# Patient Record
Sex: Female | Born: 1945
Health system: Southern US, Community
[De-identification: ages and names within clinical notes are randomized; demographics above are authoritative.]

## PROBLEM LIST (undated history)

## (undated) DIAGNOSIS — Z83719 Family history of colon polyps, unspecified: Secondary | ICD-10-CM

## (undated) DIAGNOSIS — E039 Hypothyroidism, unspecified: Secondary | ICD-10-CM

## (undated) DIAGNOSIS — J45909 Unspecified asthma, uncomplicated: Secondary | ICD-10-CM

## (undated) DIAGNOSIS — E7800 Pure hypercholesterolemia, unspecified: Secondary | ICD-10-CM

## (undated) DIAGNOSIS — IMO0002 Reserved for concepts with insufficient information to code with codable children: Secondary | ICD-10-CM

## (undated) DIAGNOSIS — C4491 Basal cell carcinoma of skin, unspecified: Secondary | ICD-10-CM

## (undated) DIAGNOSIS — R519 Headache, unspecified: Secondary | ICD-10-CM

## (undated) DIAGNOSIS — M8580 Other specified disorders of bone density and structure, unspecified site: Secondary | ICD-10-CM

## (undated) DIAGNOSIS — Z8371 Family history of colonic polyps: Secondary | ICD-10-CM

## (undated) DIAGNOSIS — R51 Headache: Secondary | ICD-10-CM

## (undated) DIAGNOSIS — N390 Urinary tract infection, site not specified: Secondary | ICD-10-CM

## (undated) DIAGNOSIS — L80 Vitiligo: Secondary | ICD-10-CM

## (undated) DIAGNOSIS — B001 Herpesviral vesicular dermatitis: Secondary | ICD-10-CM

## (undated) DIAGNOSIS — K219 Gastro-esophageal reflux disease without esophagitis: Secondary | ICD-10-CM

## (undated) DIAGNOSIS — L900 Lichen sclerosus et atrophicus: Secondary | ICD-10-CM

## (undated) DIAGNOSIS — E785 Hyperlipidemia, unspecified: Secondary | ICD-10-CM

## (undated) DIAGNOSIS — E079 Disorder of thyroid, unspecified: Secondary | ICD-10-CM

## (undated) HISTORY — PX: BREAST REDUCTION SURGERY: SHX8

## (undated) HISTORY — DX: Reserved for concepts with insufficient information to code with codable children: IMO0002

## (undated) HISTORY — DX: Disorder of thyroid, unspecified: E07.9

## (undated) HISTORY — DX: Pure hypercholesterolemia, unspecified: E78.00

## (undated) HISTORY — DX: Herpesviral vesicular dermatitis: B00.1

## (undated) HISTORY — PX: COLONOSCOPY: SHX174

## (undated) HISTORY — DX: Family history of colon polyps, unspecified: Z83.719

## (undated) HISTORY — PX: BUNIONECTOMY: SHX129

## (undated) HISTORY — DX: Other specified disorders of bone density and structure, unspecified site: M85.80

## (undated) HISTORY — DX: Vitiligo: L80

## (undated) HISTORY — PX: COLPOSCOPY: SHX161

## (undated) HISTORY — DX: Hypothyroidism, unspecified: E03.9

## (undated) HISTORY — DX: Hyperlipidemia, unspecified: E78.5

## (undated) HISTORY — DX: Basal cell carcinoma of skin, unspecified: C44.91

## (undated) HISTORY — DX: Urinary tract infection, site not specified: N39.0

## (undated) HISTORY — PX: ABDOMINAL HYSTERECTOMY: SHX81

## (undated) HISTORY — DX: Headache, unspecified: R51.9

## (undated) HISTORY — PX: TUBAL LIGATION: SHX77

## (undated) HISTORY — DX: Unspecified asthma, uncomplicated: J45.909

## (undated) HISTORY — DX: Family history of colonic polyps: Z83.71

## (undated) HISTORY — DX: Gastro-esophageal reflux disease without esophagitis: K21.9

## (undated) HISTORY — PX: OTHER SURGICAL HISTORY: SHX169

## (undated) HISTORY — DX: Lichen sclerosus et atrophicus: L90.0

## (undated) HISTORY — DX: Headache: R51

---

## 1998-09-13 ENCOUNTER — Ambulatory Visit (HOSPITAL_COMMUNITY): Admission: RE | Admit: 1998-09-13 | Discharge: 1998-09-13 | Payer: Self-pay | Admitting: Family Medicine

## 1998-09-13 ENCOUNTER — Encounter: Payer: Self-pay | Admitting: Family Medicine

## 1998-11-02 ENCOUNTER — Ambulatory Visit (HOSPITAL_COMMUNITY): Admission: RE | Admit: 1998-11-02 | Discharge: 1998-11-02 | Payer: Self-pay | Admitting: Cardiology

## 1999-11-08 ENCOUNTER — Encounter: Admission: RE | Admit: 1999-11-08 | Discharge: 1999-11-08 | Payer: Self-pay

## 2000-08-05 ENCOUNTER — Other Ambulatory Visit: Admission: RE | Admit: 2000-08-05 | Discharge: 2000-08-05 | Payer: Self-pay | Admitting: Family Medicine

## 2000-08-10 ENCOUNTER — Encounter: Payer: Self-pay | Admitting: Family Medicine

## 2000-08-10 ENCOUNTER — Encounter: Admission: RE | Admit: 2000-08-10 | Discharge: 2000-08-10 | Payer: Self-pay | Admitting: Family Medicine

## 2000-11-17 ENCOUNTER — Encounter: Payer: Self-pay | Admitting: Family Medicine

## 2000-11-17 ENCOUNTER — Encounter: Admission: RE | Admit: 2000-11-17 | Discharge: 2000-11-17 | Payer: Self-pay | Admitting: Family Medicine

## 2001-09-20 ENCOUNTER — Encounter: Payer: Self-pay | Admitting: Family Medicine

## 2001-09-20 ENCOUNTER — Ambulatory Visit (HOSPITAL_COMMUNITY): Admission: RE | Admit: 2001-09-20 | Discharge: 2001-09-20 | Payer: Self-pay | Admitting: Family Medicine

## 2001-11-18 ENCOUNTER — Encounter: Admission: RE | Admit: 2001-11-18 | Discharge: 2001-11-18 | Payer: Self-pay | Admitting: Family Medicine

## 2001-11-18 ENCOUNTER — Encounter: Payer: Self-pay | Admitting: Family Medicine

## 2002-05-29 ENCOUNTER — Emergency Department (HOSPITAL_COMMUNITY): Admission: EM | Admit: 2002-05-29 | Discharge: 2002-05-30 | Payer: Self-pay | Admitting: Anesthesiology

## 2002-05-29 ENCOUNTER — Encounter: Payer: Self-pay | Admitting: Emergency Medicine

## 2002-10-13 ENCOUNTER — Encounter: Admission: RE | Admit: 2002-10-13 | Discharge: 2002-10-13 | Payer: Self-pay | Admitting: Family Medicine

## 2002-10-13 ENCOUNTER — Encounter: Payer: Self-pay | Admitting: Family Medicine

## 2002-11-04 ENCOUNTER — Encounter: Admission: RE | Admit: 2002-11-04 | Discharge: 2002-11-04 | Payer: Self-pay | Admitting: Family Medicine

## 2002-11-04 ENCOUNTER — Encounter: Payer: Self-pay | Admitting: Family Medicine

## 2003-04-10 ENCOUNTER — Ambulatory Visit (HOSPITAL_COMMUNITY): Admission: RE | Admit: 2003-04-10 | Discharge: 2003-04-10 | Payer: Self-pay | Admitting: Family Medicine

## 2003-10-11 ENCOUNTER — Other Ambulatory Visit: Admission: RE | Admit: 2003-10-11 | Discharge: 2003-10-11 | Payer: Self-pay | Admitting: Family Medicine

## 2003-10-20 ENCOUNTER — Ambulatory Visit (HOSPITAL_COMMUNITY): Admission: RE | Admit: 2003-10-20 | Discharge: 2003-10-20 | Payer: Self-pay | Admitting: Family Medicine

## 2003-11-09 ENCOUNTER — Encounter: Admission: RE | Admit: 2003-11-09 | Discharge: 2003-11-09 | Payer: Self-pay | Admitting: Family Medicine

## 2003-11-23 ENCOUNTER — Ambulatory Visit: Admission: RE | Admit: 2003-11-23 | Discharge: 2003-11-23 | Payer: Self-pay | Admitting: Family Medicine

## 2004-01-31 ENCOUNTER — Ambulatory Visit: Payer: Self-pay | Admitting: Internal Medicine

## 2004-02-28 ENCOUNTER — Ambulatory Visit: Payer: Self-pay | Admitting: Internal Medicine

## 2004-04-02 ENCOUNTER — Ambulatory Visit: Payer: Self-pay | Admitting: Internal Medicine

## 2004-04-05 ENCOUNTER — Encounter (INDEPENDENT_AMBULATORY_CARE_PROVIDER_SITE_OTHER): Payer: Self-pay | Admitting: Specialist

## 2004-04-05 ENCOUNTER — Ambulatory Visit (HOSPITAL_COMMUNITY): Admission: RE | Admit: 2004-04-05 | Discharge: 2004-04-05 | Payer: Self-pay | Admitting: *Deleted

## 2004-06-07 ENCOUNTER — Encounter: Admission: RE | Admit: 2004-06-07 | Discharge: 2004-06-07 | Payer: Self-pay | Admitting: Endocrinology

## 2004-08-21 ENCOUNTER — Encounter: Admission: RE | Admit: 2004-08-21 | Discharge: 2004-08-21 | Payer: Self-pay | Admitting: Plastic Surgery

## 2004-09-17 HISTORY — PX: REDUCTION MAMMAPLASTY: SUR839

## 2004-10-23 ENCOUNTER — Other Ambulatory Visit: Admission: RE | Admit: 2004-10-23 | Discharge: 2004-10-23 | Payer: Self-pay | Admitting: Family Medicine

## 2005-07-25 ENCOUNTER — Encounter: Admission: RE | Admit: 2005-07-25 | Discharge: 2005-07-25 | Payer: Self-pay | Admitting: Plastic Surgery

## 2005-11-04 ENCOUNTER — Other Ambulatory Visit: Admission: RE | Admit: 2005-11-04 | Discharge: 2005-11-04 | Payer: Self-pay | Admitting: Family Medicine

## 2006-07-27 ENCOUNTER — Encounter: Admission: RE | Admit: 2006-07-27 | Discharge: 2006-07-27 | Payer: Self-pay | Admitting: Family Medicine

## 2006-11-16 ENCOUNTER — Other Ambulatory Visit: Admission: RE | Admit: 2006-11-16 | Discharge: 2006-11-16 | Payer: Self-pay | Admitting: Family Medicine

## 2006-11-19 ENCOUNTER — Encounter: Admission: RE | Admit: 2006-11-19 | Discharge: 2006-11-19 | Payer: Self-pay | Admitting: Family Medicine

## 2006-12-09 ENCOUNTER — Encounter: Admission: RE | Admit: 2006-12-09 | Discharge: 2006-12-09 | Payer: Self-pay | Admitting: Family Medicine

## 2007-10-12 ENCOUNTER — Encounter: Admission: RE | Admit: 2007-10-12 | Discharge: 2007-10-12 | Payer: Self-pay | Admitting: Family Medicine

## 2007-11-24 ENCOUNTER — Other Ambulatory Visit: Admission: RE | Admit: 2007-11-24 | Discharge: 2007-11-24 | Payer: Self-pay | Admitting: Family Medicine

## 2008-02-24 ENCOUNTER — Other Ambulatory Visit: Admission: RE | Admit: 2008-02-24 | Discharge: 2008-02-24 | Payer: Self-pay | Admitting: Family Medicine

## 2008-08-08 ENCOUNTER — Encounter (INDEPENDENT_AMBULATORY_CARE_PROVIDER_SITE_OTHER): Payer: Self-pay | Admitting: Obstetrics and Gynecology

## 2008-08-08 ENCOUNTER — Ambulatory Visit (HOSPITAL_COMMUNITY): Admission: RE | Admit: 2008-08-08 | Discharge: 2008-08-11 | Payer: Self-pay | Admitting: Obstetrics and Gynecology

## 2008-10-12 ENCOUNTER — Encounter: Admission: RE | Admit: 2008-10-12 | Discharge: 2008-10-12 | Payer: Self-pay | Admitting: Family Medicine

## 2009-01-24 ENCOUNTER — Encounter: Admission: RE | Admit: 2009-01-24 | Discharge: 2009-01-24 | Payer: Self-pay | Admitting: Family Medicine

## 2009-03-14 ENCOUNTER — Other Ambulatory Visit: Admission: RE | Admit: 2009-03-14 | Discharge: 2009-03-14 | Payer: Self-pay | Admitting: Obstetrics and Gynecology

## 2009-06-28 ENCOUNTER — Encounter: Admission: RE | Admit: 2009-06-28 | Discharge: 2009-06-28 | Payer: Self-pay | Admitting: Family Medicine

## 2009-10-15 ENCOUNTER — Encounter: Admission: RE | Admit: 2009-10-15 | Discharge: 2009-10-15 | Payer: Self-pay | Admitting: Family Medicine

## 2010-03-10 ENCOUNTER — Encounter: Payer: Self-pay | Admitting: Family Medicine

## 2010-03-18 ENCOUNTER — Other Ambulatory Visit (HOSPITAL_COMMUNITY)
Admission: RE | Admit: 2010-03-18 | Discharge: 2010-03-18 | Disposition: A | Discharge status: Home / Self Care | Payer: BC Managed Care – PPO | Source: Ambulatory Visit | Attending: Obstetrics and Gynecology | Admitting: Obstetrics and Gynecology

## 2010-03-18 ENCOUNTER — Other Ambulatory Visit: Payer: Self-pay | Admitting: Obstetrics and Gynecology

## 2010-03-18 DIAGNOSIS — Z01419 Encounter for gynecological examination (general) (routine) without abnormal findings: Secondary | ICD-10-CM | POA: Insufficient documentation

## 2010-03-26 ENCOUNTER — Other Ambulatory Visit: Payer: Self-pay | Admitting: Obstetrics and Gynecology

## 2010-03-26 DIAGNOSIS — N6459 Other signs and symptoms in breast: Secondary | ICD-10-CM

## 2010-03-28 ENCOUNTER — Ambulatory Visit
Admission: RE | Admit: 2010-03-28 | Discharge: 2010-03-28 | Disposition: A | Discharge status: Home / Self Care | Payer: BC Managed Care – PPO | Source: Ambulatory Visit | Attending: *Deleted | Admitting: *Deleted

## 2010-03-28 ENCOUNTER — Other Ambulatory Visit: Payer: Self-pay | Admitting: Obstetrics and Gynecology

## 2010-03-28 DIAGNOSIS — N6459 Other signs and symptoms in breast: Secondary | ICD-10-CM

## 2010-05-27 LAB — CROSSMATCH
ABO/RH(D): O POS
Antibody Screen: NEGATIVE

## 2010-05-27 LAB — CBC
HCT: 22 % — ABNORMAL LOW (ref 36.0–46.0)
HCT: 32.8 % — ABNORMAL LOW (ref 36.0–46.0)
HCT: 40.9 % (ref 36.0–46.0)
Hemoglobin: 11.4 g/dL — ABNORMAL LOW (ref 12.0–15.0)
Hemoglobin: 14 g/dL (ref 12.0–15.0)
Hemoglobin: 7.7 g/dL — CL (ref 12.0–15.0)
MCHC: 34.3 g/dL (ref 30.0–36.0)
MCHC: 34.7 g/dL (ref 30.0–36.0)
MCHC: 34.8 g/dL (ref 30.0–36.0)
MCV: 96 fL (ref 78.0–100.0)
Platelets: 176 10*3/uL (ref 150–400)
Platelets: 187 10*3/uL (ref 150–400)
RBC: 4.26 MIL/uL (ref 3.87–5.11)
RDW: 12.7 % (ref 11.5–15.5)
RDW: 13 % (ref 11.5–15.5)
RDW: 14.1 % (ref 11.5–15.5)
WBC: 15.7 10*3/uL — ABNORMAL HIGH (ref 4.0–10.5)
WBC: 9.6 10*3/uL (ref 4.0–10.5)

## 2010-05-27 LAB — COMPREHENSIVE METABOLIC PANEL
ALT: 34 U/L (ref 0–35)
Alkaline Phosphatase: 50 U/L (ref 39–117)
Calcium: 10 mg/dL (ref 8.4–10.5)
Creatinine, Ser: 0.66 mg/dL (ref 0.4–1.2)
Potassium: 4.9 mEq/L (ref 3.5–5.1)
Sodium: 143 mEq/L (ref 135–145)
Total Bilirubin: 0.8 mg/dL (ref 0.3–1.2)
Total Protein: 7 g/dL (ref 6.0–8.3)

## 2010-05-27 LAB — HEMOGLOBIN AND HEMATOCRIT, BLOOD: HCT: 23.9 % — ABNORMAL LOW (ref 36.0–46.0)

## 2010-05-27 LAB — TYPE AND SCREEN
ABO/RH(D): O POS
Antibody Screen: NEGATIVE

## 2010-05-27 LAB — BASIC METABOLIC PANEL
BUN: 10 mg/dL (ref 6–23)
Calcium: 8.5 mg/dL (ref 8.4–10.5)
Glucose, Bld: 107 mg/dL — ABNORMAL HIGH (ref 70–99)

## 2010-06-17 ENCOUNTER — Other Ambulatory Visit: Payer: Self-pay | Admitting: Family Medicine

## 2010-06-17 DIAGNOSIS — N6459 Other signs and symptoms in breast: Secondary | ICD-10-CM

## 2010-07-02 NOTE — Op Note (Signed)
Toni Curry, Toni Curry                  ACCOUNT NO.:  [id number]   MEDICAL RECORD NO.:  [id number]          PATIENT TYPE:  OIB   LOCATION:  9399                          FACILITY:  [facility]   PHYSICIAN:  Charles A. Delcambre, MDDATE OF BIRTH:  1945-12-08   DATE OF PROCEDURE:  08/08/2008  DATE OF DISCHARGE:                               OPERATIVE REPORT   PREOPERATIVE DIAGNOSES:  1. Uterine prolapse.  2. Cystocele.  3. Rectocele.   POSTOPERATIVE DIAGNOSES:  1. Uterine prolapse.  2. Cystocele.  3. Rectocele.   PROCEDURES:  1. Transvaginal hysterectomy.  2. Bilateral salpingo-oophorectomy.  3. Anterior repair.  4. Posterior repair.   SURGEON:  Charles A. Sydnee Cabal, MD   ASSISTANT:  Gerald Leitz, MD   ANESTHESIA:  General by the endotracheal route.   OPERATIVE FINDINGS:  Small uterus, small ovaries, normal tubes, large  cystocele, moderate rectocele.   SPECIMEN:  Uterus, tubes, and ovaries to pathology.   ESTIMATED BLOOD LOSS:  300 mL.   COUNTS:  Instrument, sponge, and needle count correct x2.   COMPLICATIONS:  None.   DESCRIPTION OF PROCEDURE:  The patient was taken to the operating room,  placed in supine position.  General anesthetic was induced without  difficulty.  She was then placed in dorsal lithotomy position.  Sterile  prep and drape was undertaken.  A weighted speculum was placed in  vagina.  Cervix was injected circumferentially with 1% lidocaine with  1:200 epinephrine, total of 10 mL.  Scoring incision was made around the  cervix.  Bladder was dissected anteriorly cutting the bladder pillars  and then finger dissection up to the peritoneal edge.  Peritoneum was  then entered with Metzenbaum scissors without damage to the bladder.  Posterior colpotomy was done without injury to the rectum or surrounding  structures.  The non-metal weighted speculum was placed into the  posterior colpotomy space.  Uterosacral ligaments were taken  bilaterally, transfixion stitched,  and held.  The uterine artery and  vessel were taken and transfixion stitched and held.  Third clamp went  through remainder of the way up and incorporated the tube and the round  ligament.  This was cut on either side and uterus was removed, free  ties, and transfixion stitches were placed on these pedicles.  Ovaries  were teased down and the tube and ovary were taken on either side with a  Kelly clamp and freeze tie, and then a transfixion stitch of 0 Vicryl.  There was some bleeding on the sidewall on either side just below the  pedicle 2-0 Vicryl, SH needle was used to achieve hemostasis, which was  excellent.  Peritoneum was then closed with 3-0 chromic running  nonlocking suture.  Attention was then turned to the anterior repair.  Bladder mucosa was undermined with Metzenbaum scissors and Allis clamps  were used to hold the bladder and the vaginal mucosa using the knife and  Metzenbaum scissors.  The cystocele was dissected off the vaginal  mucosa.  This was done on both sides adequately and without difficulty.  The cystocele was reduced with 3-0 chromic  pursestring and then  plicating stitches x6 were placed with 2-0 Vicryl across the endopelvic  fascia reducing the cystocele further.  Hemostasis was excellent after  this closure was done.  Excess vaginal mucosa was taken on both sides  and the vaginal mucosa was reapproximated with 2-0 Vicryl running  locking suture.  Attention was then turned to the posterior repair.  It  did appear that the rectocele was large enough to indicate repair.  Cuff  was closed horizontally with 2-0 Vicryl anchoring the cuff to the  uterosacral ligaments.  The triangle incision and excision on the  perineal body was done to expose the rectal mucosa and the vaginal  mucosa was dissected off the rectal mucosa without difficulty or injury  to the rectum.  Plicating stitches including the rectal pillars and the  endopelvic fascia included good repair and  good supportive shelf.  Minimal vaginal mucosa was excised so as not to cause too much  shortening of the vagina.  The vaginal mucosa was then closed like a  second-degree episiotomy or laceration with running locking 2-0 Vicryl  and then running up the perineal body.  One inch gauze was placed in the  vagina with Estrace cream.  The patient was taken to recovery with  physician in attendance having tolerated procedure well.      Charles A. Sydnee Cabal, MD  Electronically Signed     CAD/MEDQ  D:  08/08/2008  T:  08/09/2008  Job:  [id number]

## 2010-07-05 NOTE — Discharge Summary (Signed)
Toni Curry, Toni Curry                  ACCOUNT NO.:  [id number]   MEDICAL RECORD NO.:  [id number]          PATIENT TYPE:  OIB   LOCATION:  9319                          FACILITY:  [facility]   PHYSICIAN:  Charles A. Delcambre, MDDATE OF BIRTH:  30-Aug-1945   DATE OF ADMISSION:  08/08/2008  DATE OF DISCHARGE:  08/11/2008                               DISCHARGE SUMMARY   PRIMARY DISCHARGE DIAGNOSES:  1. Uterine prolapse.  2. Cystocele.  3. Rectocele.  4. Likely cuff hematoma.   PROCEDURE:  Transvaginal hysterectomy and bilateral salpingo-  oophorectomy, anterior and posterior repair.  The patient is discharged  home on August 11, 2008.  Postop day #3, that morning after transfusion,  hemoglobin 9.8 and hematocrit 28.1, white blood cell count 8.7,  platelets 176, see hospital course for specifics.  Pathology did return  a benign tissue.  She did have a low-grade squamous intraepithelial  lesion extending to the ectocervical margin.  There was a benign  endometrial polyp.  Ovaries were normal.  Uterus, ovaries, cervix, and  tubes were sent to pathology with findings as noted.  EKG was done and  was normal.  Sinus rhythm without evidence of ischemia.   HISTORY AND PHYSICAL:  Dictated on the chart.  The patient discharged  home on the day to discuss August 11, 2008, with precautions given for a  chest pain, shortness of breath, diaphoresis, radiation of pain down the  left arm or to the back, increased pain, or bleeding vaginally.  She was  given prescription of Darvocet-N 100, Percocet 1-2 p.o. q.4 h p.r.n.  #30, and Motrin 800 mg 1 p.o. q.8 h p.r.n. #30, Colace over-the-counter  b.i.d. 100 mg, and was recommended she was convalescent home.  No  lifting for 4-6 weeks greater than 20 pounds.  No driving for 1 week and  shower okay.  No bath for 4 weeks.  No sexual intercourse or vaginal  penetration with anything for 6 weeks and she was to take iron 1 tablet  once a day over-the-counter.  History and  physical is dictated on the  chart.   HOSPITAL COURSE:  The patient was admitted, underwent surgery as noted  above.  She had routine postoperative course.  On postop day #1,  hematocrit was 32.8, hemoglobin was 11.4.  She had some nausea and  vomiting and Percocet was discontinued.  CBC was ordered for the next  morning.  She had a funny feeling in her chest when walking as well as  at rest.  There was no radiation or diaphoresis.  She walked without  dizziness.  However, hemoglobin returned 7.7 and hematocrit 22.0,  platelet was 187.  Sodium 141, potassium 4.1, chloride 107, CO2 31, BUN  10, creatinine 0.66, glucose 107 random.  She had good urine output  overnight 1000 mL, but with hemoglobin going from preop 14.0 to postop  day 1, 11.4 then postop day 2, 7.7 as this was an incidental drawn.  Dr.  Richardson Dopp could not remember why she ordered this repeat test.  We did feel  that there was probable hematoma.  Hemoglobin was repeated in 4 hours  and was 8.2.  It is felt this was a disvariation in the lab value, but  consistent with the likely cuff hematoma, stable, however, she was  having symptoms of chest discomfort and electrocardiogram was done.  There was no evidence of ischemia.  I recommended that with her  weakness, tachycardia, and symptomatic chest, or heart findings that we  did transfuse her.  She gave informed consent excepted risks of  hepatitis, HIV, transfusion reaction of fluid overload.  She was  transfused 2 units of packed red cells with Lasix 20 mg between units,  Tylenol before each unit 650 mg, Benadryl 25 mg 42 units.  Blood  pressure, pulse remains stable, pulse in the 90-110 range, and then  after the transfusion, pulse down in the 18 range was noted.  Repeat  hemoglobin/hematocrit after the  transfusion was 9.8/28.1 respectively.  As hemoglobin had risen  appropriately from the 7.7 finding, I felt she was stable.  She looks  stable.  Vitals were stable.  Urine  output was good.  Pain was  controlled.  She was therefore discharged home on postop day #3.      Charles A. Sydnee Cabal, MD  Electronically Signed     CAD/MEDQ  D:  08/19/2008  T:  08/19/2008  Job:  [id number]

## 2010-10-10 ENCOUNTER — Other Ambulatory Visit: Payer: Self-pay | Admitting: Family Medicine

## 2010-10-10 DIAGNOSIS — Z1231 Encounter for screening mammogram for malignant neoplasm of breast: Secondary | ICD-10-CM

## 2010-10-28 ENCOUNTER — Ambulatory Visit
Admission: RE | Admit: 2010-10-28 | Discharge: 2010-10-28 | Disposition: A | Discharge status: Home / Self Care | Payer: BC Managed Care – PPO | Source: Ambulatory Visit | Attending: Family Medicine | Admitting: Family Medicine

## 2010-10-28 DIAGNOSIS — Z1231 Encounter for screening mammogram for malignant neoplasm of breast: Secondary | ICD-10-CM

## 2010-12-09 ENCOUNTER — Other Ambulatory Visit: Payer: Self-pay | Admitting: Surgery

## 2011-05-23 ENCOUNTER — Other Ambulatory Visit: Payer: Self-pay | Admitting: Obstetrics and Gynecology

## 2011-05-23 ENCOUNTER — Other Ambulatory Visit (HOSPITAL_COMMUNITY)
Admission: RE | Admit: 2011-05-23 | Discharge: 2011-05-23 | Disposition: A | Discharge status: Home / Self Care | Payer: BC Managed Care – PPO | Source: Ambulatory Visit | Attending: Obstetrics and Gynecology | Admitting: Obstetrics and Gynecology

## 2011-05-23 DIAGNOSIS — Z01419 Encounter for gynecological examination (general) (routine) without abnormal findings: Secondary | ICD-10-CM | POA: Insufficient documentation

## 2011-09-09 ENCOUNTER — Ambulatory Visit
Admission: RE | Admit: 2011-09-09 | Discharge: 2011-09-09 | Disposition: A | Discharge status: Home / Self Care | Payer: BC Managed Care – PPO | Source: Ambulatory Visit | Attending: Family Medicine | Admitting: Family Medicine

## 2011-09-09 ENCOUNTER — Other Ambulatory Visit: Payer: Self-pay | Admitting: Family Medicine

## 2011-09-09 DIAGNOSIS — M79675 Pain in left toe(s): Secondary | ICD-10-CM

## 2011-09-09 DIAGNOSIS — M79673 Pain in unspecified foot: Secondary | ICD-10-CM

## 2011-10-16 ENCOUNTER — Other Ambulatory Visit: Payer: Self-pay | Admitting: Family Medicine

## 2011-10-16 DIAGNOSIS — Z1231 Encounter for screening mammogram for malignant neoplasm of breast: Secondary | ICD-10-CM

## 2011-10-30 ENCOUNTER — Ambulatory Visit
Admission: RE | Admit: 2011-10-30 | Discharge: 2011-10-30 | Disposition: A | Discharge status: Home / Self Care | Payer: BC Managed Care – PPO | Source: Ambulatory Visit | Attending: Family Medicine | Admitting: Family Medicine

## 2011-10-30 DIAGNOSIS — Z1231 Encounter for screening mammogram for malignant neoplasm of breast: Secondary | ICD-10-CM

## 2012-06-16 ENCOUNTER — Other Ambulatory Visit (HOSPITAL_COMMUNITY)
Admission: RE | Admit: 2012-06-16 | Discharge: 2012-06-16 | Disposition: A | Discharge status: Home / Self Care | Payer: BC Managed Care – PPO | Source: Ambulatory Visit | Attending: Obstetrics and Gynecology | Admitting: Obstetrics and Gynecology

## 2012-06-16 ENCOUNTER — Other Ambulatory Visit: Payer: Self-pay | Admitting: Obstetrics and Gynecology

## 2012-06-16 DIAGNOSIS — Z1151 Encounter for screening for human papillomavirus (HPV): Secondary | ICD-10-CM | POA: Insufficient documentation

## 2012-06-16 DIAGNOSIS — N63 Unspecified lump in unspecified breast: Secondary | ICD-10-CM

## 2012-06-16 DIAGNOSIS — Z01419 Encounter for gynecological examination (general) (routine) without abnormal findings: Secondary | ICD-10-CM | POA: Insufficient documentation

## 2012-06-28 ENCOUNTER — Ambulatory Visit
Admission: RE | Admit: 2012-06-28 | Discharge: 2012-06-28 | Disposition: A | Discharge status: Home / Self Care | Payer: BC Managed Care – PPO | Source: Ambulatory Visit | Attending: Obstetrics and Gynecology | Admitting: Obstetrics and Gynecology

## 2012-06-28 DIAGNOSIS — N63 Unspecified lump in unspecified breast: Secondary | ICD-10-CM

## 2012-09-30 ENCOUNTER — Other Ambulatory Visit: Payer: Self-pay

## 2012-09-30 DIAGNOSIS — Z1231 Encounter for screening mammogram for malignant neoplasm of breast: Secondary | ICD-10-CM

## 2012-11-01 ENCOUNTER — Ambulatory Visit
Admission: RE | Admit: 2012-11-01 | Discharge: 2012-11-01 | Disposition: A | Discharge status: Home / Self Care | Payer: BC Managed Care – PPO | Source: Ambulatory Visit

## 2012-11-01 DIAGNOSIS — Z1231 Encounter for screening mammogram for malignant neoplasm of breast: Secondary | ICD-10-CM

## 2012-12-27 ENCOUNTER — Other Ambulatory Visit: Payer: Self-pay | Admitting: Family Medicine

## 2012-12-27 ENCOUNTER — Ambulatory Visit
Admission: RE | Admit: 2012-12-27 | Discharge: 2012-12-27 | Disposition: A | Discharge status: Home / Self Care | Payer: BC Managed Care – PPO | Source: Ambulatory Visit | Attending: Family Medicine | Admitting: Family Medicine

## 2012-12-27 DIAGNOSIS — M549 Dorsalgia, unspecified: Secondary | ICD-10-CM

## 2013-10-04 ENCOUNTER — Other Ambulatory Visit: Payer: Self-pay

## 2013-10-04 DIAGNOSIS — Z1231 Encounter for screening mammogram for malignant neoplasm of breast: Secondary | ICD-10-CM

## 2013-11-02 ENCOUNTER — Ambulatory Visit: Payer: BC Managed Care – PPO

## 2013-11-07 ENCOUNTER — Ambulatory Visit
Admission: RE | Admit: 2013-11-07 | Discharge: 2013-11-07 | Disposition: A | Discharge status: Home / Self Care | Payer: BC Managed Care – PPO | Source: Ambulatory Visit

## 2013-11-07 DIAGNOSIS — Z1231 Encounter for screening mammogram for malignant neoplasm of breast: Secondary | ICD-10-CM

## 2014-04-11 ENCOUNTER — Other Ambulatory Visit: Payer: Self-pay | Admitting: Family Medicine

## 2014-04-11 ENCOUNTER — Ambulatory Visit
Admission: RE | Admit: 2014-04-11 | Discharge: 2014-04-11 | Disposition: A | Discharge status: Home / Self Care | Payer: BLUE CROSS/BLUE SHIELD | Source: Ambulatory Visit | Attending: Family Medicine | Admitting: Family Medicine

## 2014-04-11 DIAGNOSIS — M79671 Pain in right foot: Secondary | ICD-10-CM

## 2014-04-24 ENCOUNTER — Encounter (HOSPITAL_BASED_OUTPATIENT_CLINIC_OR_DEPARTMENT_OTHER): Payer: Self-pay | Admitting: Emergency Medicine

## 2014-04-24 ENCOUNTER — Emergency Department (HOSPITAL_BASED_OUTPATIENT_CLINIC_OR_DEPARTMENT_OTHER): Payer: BLUE CROSS/BLUE SHIELD

## 2014-04-24 ENCOUNTER — Emergency Department (HOSPITAL_BASED_OUTPATIENT_CLINIC_OR_DEPARTMENT_OTHER)
Admission: EM | Admit: 2014-04-24 | Discharge: 2014-04-24 | Disposition: A | Discharge status: Home / Self Care | Payer: BLUE CROSS/BLUE SHIELD | Attending: Emergency Medicine | Admitting: Emergency Medicine

## 2014-04-24 DIAGNOSIS — R079 Chest pain, unspecified: Secondary | ICD-10-CM | POA: Insufficient documentation

## 2014-04-24 DIAGNOSIS — E785 Hyperlipidemia, unspecified: Secondary | ICD-10-CM | POA: Insufficient documentation

## 2014-04-24 DIAGNOSIS — M1990 Unspecified osteoarthritis, unspecified site: Secondary | ICD-10-CM | POA: Diagnosis not present

## 2014-04-24 DIAGNOSIS — E079 Disorder of thyroid, unspecified: Secondary | ICD-10-CM | POA: Insufficient documentation

## 2014-04-24 DIAGNOSIS — Z85828 Personal history of other malignant neoplasm of skin: Secondary | ICD-10-CM | POA: Insufficient documentation

## 2014-04-24 DIAGNOSIS — Z79899 Other long term (current) drug therapy: Secondary | ICD-10-CM | POA: Insufficient documentation

## 2014-04-24 DIAGNOSIS — Z872 Personal history of diseases of the skin and subcutaneous tissue: Secondary | ICD-10-CM | POA: Diagnosis not present

## 2014-04-24 DIAGNOSIS — J45909 Unspecified asthma, uncomplicated: Secondary | ICD-10-CM | POA: Insufficient documentation

## 2014-04-24 DIAGNOSIS — Z8619 Personal history of other infectious and parasitic diseases: Secondary | ICD-10-CM | POA: Insufficient documentation

## 2014-04-24 DIAGNOSIS — Z7952 Long term (current) use of systemic steroids: Secondary | ICD-10-CM | POA: Insufficient documentation

## 2014-04-24 DIAGNOSIS — K219 Gastro-esophageal reflux disease without esophagitis: Secondary | ICD-10-CM | POA: Insufficient documentation

## 2014-04-24 LAB — CBC WITH DIFFERENTIAL/PLATELET
BASOS ABS: 0.1 10*3/uL (ref 0.0–0.1)
BASOS PCT: 1 % (ref 0–1)
EOS ABS: 0.4 10*3/uL (ref 0.0–0.7)
Eosinophils Relative: 7 % — ABNORMAL HIGH (ref 0–5)
HCT: 40.8 % (ref 36.0–46.0)
Hemoglobin: 13.4 g/dL (ref 12.0–15.0)
Lymphocytes Relative: 38 % (ref 12–46)
Lymphs Abs: 2.3 10*3/uL (ref 0.7–4.0)
MCH: 32 pg (ref 26.0–34.0)
MCHC: 32.8 g/dL (ref 30.0–36.0)
MCV: 97.4 fL (ref 78.0–100.0)
MONO ABS: 0.5 10*3/uL (ref 0.1–1.0)
Monocytes Relative: 9 % (ref 3–12)
Neutro Abs: 2.7 10*3/uL (ref 1.7–7.7)
Neutrophils Relative %: 45 % (ref 43–77)
Platelets: 265 10*3/uL (ref 150–400)
RBC: 4.19 MIL/uL (ref 3.87–5.11)
RDW: 12.6 % (ref 11.5–15.5)
WBC: 6 10*3/uL (ref 4.0–10.5)

## 2014-04-24 LAB — COMPREHENSIVE METABOLIC PANEL
ALBUMIN: 3.9 g/dL (ref 3.5–5.2)
ALK PHOS: 59 U/L (ref 39–117)
ALT: 21 U/L (ref 0–35)
AST: 20 U/L (ref 0–37)
Anion gap: 1 — ABNORMAL LOW (ref 5–15)
BUN: 14 mg/dL (ref 6–23)
CHLORIDE: 108 mmol/L (ref 96–112)
CO2: 27 mmol/L (ref 19–32)
Calcium: 8.9 mg/dL (ref 8.4–10.5)
Creatinine, Ser: 0.64 mg/dL (ref 0.50–1.10)
GFR calc Af Amer: >90 mL/min (ref >90)
GFR calc non Af Amer: >90 mL/min (ref >90)
Glucose, Bld: 103 mg/dL — ABNORMAL HIGH (ref 70–99)
POTASSIUM: 4.3 mmol/L (ref 3.5–5.1)
Sodium: 136 mmol/L (ref 135–145)
Total Bilirubin: 0.5 mg/dL (ref 0.3–1.2)
Total Protein: 6.6 g/dL (ref 6.0–8.3)

## 2014-04-24 LAB — TROPONIN I: Troponin I: <0.03 ng/mL (ref <0.031)

## 2014-04-24 MED ORDER — MORPHINE SULFATE 4 MG/ML IJ SOLN
4.0000 mg | Freq: Once | INTRAMUSCULAR | Status: AC
  Administered 2014-04-24: 4 mg via INTRAVENOUS
  Filled 2014-04-24: qty 1

## 2014-04-24 MED ORDER — HYDROCODONE-ACETAMINOPHEN 5-325 MG PO TABS: 1.0000 | ORAL_TABLET | Freq: Four times a day (QID) | ORAL | Status: DC | PRN

## 2014-04-24 MED ORDER — IOHEXOL 350 MG/ML SOLN
100.0000 mL | Freq: Once | INTRAVENOUS | Status: AC | PRN
  Administered 2014-04-24: 100 mL via INTRAVENOUS

## 2014-04-24 MED ORDER — IOHEXOL 300 MG/ML  SOLN: 100.0000 mL | Freq: Once | INTRAMUSCULAR | Status: DC | PRN

## 2014-04-24 NOTE — ED Provider Notes (Signed)
CSN: [id number]     Arrival date & time 04/24/14  8527 History   First MD Initiated Contact with Patient 04/24/14 0827     Chief Complaint  Patient presents with  . Chest Pain     (Consider location/radiation/quality/duration/timing/severity/associated sxs/prior Treatment) HPI Comments: Patient is a 69 year old female with past medical history of high cholesterol, hypothyroidism. She presents for evaluation of pain in her left upper back radiating into her chest that started 1-2 hours prior to arrival. She denies any specific injury or trauma. She denies any shortness of breath, diaphoresis, nausea along with the pain. It is worse when she leans forward or stands, and when she moves her left arm. It is relieved with rest.  Patient is a 69 y.o. female presenting with chest pain. The history is provided by the patient.  Chest Pain Pain location:  L chest Pain quality: stabbing   Pain radiates to:  Does not radiate Pain severity:  Moderate Onset quality:  Sudden Duration:  2 hours Timing:  Constant Progression:  Unchanged Chronicity:  New Context: breathing and movement   Relieved by:  Nothing Worsened by:  Deep breathing and movement Ineffective treatments:  None tried   Past Medical History  Diagnosis Date  . Esophageal reflux   . Thyroid disease   . Hypothyroidism   . FH: colonic polyps   . Hyperlipidemia   . Hypercholesterolemia   . Osteopenia   . Vitiligo   . Cystocele   . Frequent headaches   . Recurrent UTI   . Asthma   . Skin cancer, basal cell   . Herpes labialis   . Lichen sclerosus    Past Surgical History  Procedure Laterality Date  . Bunionectomy    . Arthroscopic knee surgery    . Breast reduction surgery    . Colposcopy    . Tvh    . Tubal ligation    . Tummy tuck    . Colonoscopy    . Abdominal hysterectomy     Family History  Problem Relation Age of Onset  . Heart disease Mother   . Renal cancer Mother   . CAD Mother   . Rheumatic fever  Father   . Heart disease Father   . Alcohol abuse Father   . Heart disease Sister    History  Substance Use Topics  . Smoking status: Never Smoker   . Smokeless tobacco: Not on file  . Alcohol Use: Not on file   OB History    No data available     Review of Systems  Cardiovascular: Positive for chest pain.  All other systems reviewed and are negative.     Allergies  Codeine; Dolobid; Prednisone; Simvastatin; and Vicodin  Home Medications   Prior to Admission medications   Medication Sig Start Date End Date Taking? Authorizing Provider  albuterol (PROVENTIL HFA;VENTOLIN HFA) 108 (90 BASE) MCG/ACT inhaler Inhale into the lungs every 6 (six) hours as needed for wheezing or shortness of breath.   Yes Historical Provider, MD  atorvastatin (LIPITOR) 10 MG tablet Take 10 mg by mouth daily.   Yes Historical Provider, MD  Calcium Carbonate-Vitamin D (CALCIUM-VITAMIN D) 500-200 MG-UNIT per tablet Take 1 tablet by mouth daily.   Yes Historical Provider, MD  esomeprazole (NEXIUM) 40 MG capsule Take 40 mg by mouth daily at 12 noon.   Yes Historical Provider, MD  fluticasone (FLONASE) 50 MCG/ACT nasal spray Place into both nostrils daily.   Yes Historical Provider, MD  levothyroxine (  SYNTHROID, LEVOTHROID) 112 MCG tablet Take 112 mcg by mouth daily before breakfast.   Yes Historical Provider, MD  montelukast (SINGULAIR) 10 MG tablet Take 10 mg by mouth at bedtime.   Yes Historical Provider, MD  Multiple Vitamin (MULTIVITAMIN) capsule Take 1 capsule by mouth daily.   Yes Historical Provider, MD  Vitamin D, Ergocalciferol, (DRISDOL) 50000 UNITS CAPS capsule Take 50,000 Units by mouth every 7 (seven) days.   Yes Historical Provider, MD   BP 193/87 mmHg  Pulse 69  Temp(Src) 98.2 F (36.8 C) (Oral)  Resp 16  Ht 5' (1.524 m)  Wt 160 lb (72.576 kg)  BMI 31.25 kg/m2  SpO2 100% Physical Exam  Constitutional: She is oriented to person, place, and time. She appears well-developed and  well-nourished. No distress.  HENT:  Head: Normocephalic and atraumatic.  Neck: Normal range of motion. Neck supple.  Cardiovascular: Normal rate and regular rhythm.  Exam reveals no gallop and no friction rub.   No murmur heard. Pulmonary/Chest: Effort normal and breath sounds normal. No respiratory distress. She has no wheezes.  Abdominal: Soft. Bowel sounds are normal. She exhibits no distension. There is no tenderness.  Musculoskeletal: Normal range of motion.  Neurological: She is alert and oriented to person, place, and time.  Skin: Skin is warm and dry. She is not diaphoretic.  Nursing note and vitals reviewed.   ED Course  Procedures (including critical care time) Labs Review Labs Reviewed  COMPREHENSIVE METABOLIC PANEL  CBC WITH DIFFERENTIAL/PLATELET  TROPONIN I    Imaging Review No results found.   EKG Interpretation   Date/Time:  Monday April 24 2014 08:21:18 EST Ventricular Rate:  90 PR Interval:  168 QRS Duration: 90 QT Interval:  382 QTC Calculation: 467 R Axis:   59 Text Interpretation:  Normal sinus rhythm Nonspecific ST abnormality  Abnormal ECG Confirmed by DELOS  MD, Diante Barley ([id number]) on 04/24/2014 8:26:56  AM      MDM   Final diagnoses:  None    Patient is a 69 year old female who presents for evaluation of left-sided upper back and chest pain. Her EKG and troponin 2 are negative and I highly doubt a cardiac etiology. She has had negative stress tests in the past and today symptoms are atypical for cardiac pain. CT scan is negative for pulmonary embolism or dissection, and other pathology. I suspect a musculoskeletal etiology and I believe she is appropriate for discharge. To follow-up with her doctor in the next week and return if her symptoms worsen or change.    Veryl Speak, MD 04/24/14 [phone]

## 2014-04-24 NOTE — Discharge Instructions (Signed)
Ibuprofen 600 mg 3 times daily for the next 5 days.  Hydrocodone as prescribed as needed for pain not relieved with ibuprofen.  Follow up with your primary Dr. in the next week, and return to the ER if your symptoms significantly worsen or change.   Chest Pain (Nonspecific) It is often hard to give a specific diagnosis for the cause of chest pain. There is always a chance that your pain could be related to something serious, such as a heart attack or a blood clot in the lungs. You need to follow up with your health care provider for further evaluation. CAUSES   Heartburn.  Pneumonia or bronchitis.  Anxiety or stress.  Inflammation around your heart (pericarditis) or lung (pleuritis or pleurisy).  A blood clot in the lung.  A collapsed lung (pneumothorax). It can develop suddenly on its own (spontaneous pneumothorax) or from trauma to the chest.  Shingles infection (herpes zoster virus). The chest wall is composed of bones, muscles, and cartilage. Any of these can be the source of the pain.  The bones can be bruised by injury.  The muscles or cartilage can be strained by coughing or overwork.  The cartilage can be affected by inflammation and become sore (costochondritis). DIAGNOSIS  Lab tests or other studies may be needed to find the cause of your pain. Your health care provider may have you take a test called an ambulatory electrocardiogram (ECG). An ECG records your heartbeat patterns over a 24-hour period. You may also have other tests, such as:  Transthoracic echocardiogram (TTE). During echocardiography, sound waves are used to evaluate how blood flows through your heart.  Transesophageal echocardiogram (TEE).  Cardiac monitoring. This allows your health care provider to monitor your heart rate and rhythm in real time.  Holter monitor. This is a portable device that records your heartbeat and can help diagnose heart arrhythmias. It allows your health care provider to  track your heart activity for several days, if needed.  Stress tests by exercise or by giving medicine that makes the heart beat faster. TREATMENT   Treatment depends on what may be causing your chest pain. Treatment may include:  Acid blockers for heartburn.  Anti-inflammatory medicine.  Pain medicine for inflammatory conditions.  Antibiotics if an infection is present.  You may be advised to change lifestyle habits. This includes stopping smoking and avoiding alcohol, caffeine, and chocolate.  You may be advised to keep your head raised (elevated) when sleeping. This reduces the chance of acid going backward from your stomach into your esophagus. Most of the time, nonspecific chest pain will improve within 2-3 days with rest and mild pain medicine.  HOME CARE INSTRUCTIONS   If antibiotics were prescribed, take them as directed. Finish them even if you start to feel better.  For the next few days, avoid physical activities that bring on chest pain. Continue physical activities as directed.  Do not use any tobacco products, including cigarettes, chewing tobacco, or electronic cigarettes.  Avoid drinking alcohol.  Only take medicine as directed by your health care provider.  Follow your health care provider's suggestions for further testing if your chest pain does not go away.  Keep any follow-up appointments you made. If you do not go to an appointment, you could develop lasting (chronic) problems with pain. If there is any problem keeping an appointment, call to reschedule. SEEK MEDICAL CARE IF:   Your chest pain does not go away, even after treatment.  You have a rash with  blisters on your chest.  You have a fever. SEEK IMMEDIATE MEDICAL CARE IF:   You have increased chest pain or pain that spreads to your arm, neck, jaw, back, or abdomen.  You have shortness of breath.  You have an increasing cough, or you cough up blood.  You have severe back or abdominal  pain.  You feel nauseous or vomit.  You have severe weakness.  You faint.  You have chills. This is an emergency. Do not wait to see if the pain will go away. Get medical help at once. Call your local emergency services (911 in [place] Do not drive yourself to the hospital. MAKE SURE YOU:   Understand these instructions.  Will watch your condition.  Will get help right away if you are not doing well or get worse. Document Released: 11/13/2004 Document Revised: 02/08/2013 Document Reviewed: 09/09/2007 [facility] Patient Information 2015 [place], [place]. This information is not intended to replace advice given to you by your health care provider. Make sure you discuss any questions you have with your health care provider.

## 2014-04-24 NOTE — ED Notes (Signed)
Pt states she started having pain in her back radiating to her chest.  No dizziness, no diaphoresis, no sob, no weakness.  Some nausea but she states it was due to taking medications on an empty stomach.

## 2014-04-24 NOTE — ED Notes (Signed)
Patient transported to X-ray 

## 2014-04-24 NOTE — ED Notes (Signed)
MD at bedside. 

## 2014-10-16 ENCOUNTER — Other Ambulatory Visit: Payer: Self-pay

## 2014-10-16 DIAGNOSIS — Z1231 Encounter for screening mammogram for malignant neoplasm of breast: Secondary | ICD-10-CM

## 2014-11-20 ENCOUNTER — Ambulatory Visit
Admission: RE | Admit: 2014-11-20 | Discharge: 2014-11-20 | Disposition: A | Discharge status: Home / Self Care | Payer: BLUE CROSS/BLUE SHIELD | Source: Ambulatory Visit

## 2014-11-20 DIAGNOSIS — Z1231 Encounter for screening mammogram for malignant neoplasm of breast: Secondary | ICD-10-CM

## 2015-03-08 ENCOUNTER — Encounter: Payer: Self-pay | Admitting: Cardiology

## 2015-03-08 ENCOUNTER — Ambulatory Visit (INDEPENDENT_AMBULATORY_CARE_PROVIDER_SITE_OTHER): Payer: BLUE CROSS/BLUE SHIELD | Admitting: Cardiology

## 2015-03-08 VITALS — BP 128/84 | HR 78 | Ht 60.5 in | Wt 171.1 lb

## 2015-03-08 DIAGNOSIS — R5383 Other fatigue: Secondary | ICD-10-CM | POA: Insufficient documentation

## 2015-03-08 DIAGNOSIS — R079 Chest pain, unspecified: Secondary | ICD-10-CM | POA: Diagnosis not present

## 2015-03-08 DIAGNOSIS — R011 Cardiac murmur, unspecified: Secondary | ICD-10-CM | POA: Insufficient documentation

## 2015-03-08 DIAGNOSIS — Z8249 Family history of ischemic heart disease and other diseases of the circulatory system: Secondary | ICD-10-CM | POA: Diagnosis not present

## 2015-03-08 DIAGNOSIS — E785 Hyperlipidemia, unspecified: Secondary | ICD-10-CM

## 2015-03-08 NOTE — Progress Notes (Signed)
Cardiology Office Note    Date:  03/08/2015   ID:  Toni Curry, DOB [birth year], MRN [id number]  PCP:  Shirline Frees, MD  Cardiologist:   Candee Furbish, MD     History of Present Illness:  Toni Curry is a 70 y.o. female here for evaluation of chest pain, fatigue at the request of Dr. Kenton Kingfisher.  On 01/24/2011 she underwent a nuclear stress test under the supervision of Dr. Tamala Julian. It was low risk, normal. This was performed at the time because of dyspnea on exertion over 6 months duration. Every living member of her immediate family had either coronary disease, stenting or bypass surgery area her father died at age 73 of rheumatic heart disease. Ejection fraction was 75%., Prior chest x-ray on 12/27/12 was normal.   Recently she's been extremely tired when walking and climbing stairs she's noted some atypical chest discomfort, mostly fleeting at night. She was wondering if it was secondary to cutting back on Nexium. Consider stress test.when she was examined by her nurse at work, an extra heart sound was acknowledged.  Unfortunately, her son who is a Firefighter, who smoked, died of heart attack. He had his first heart attack when he was 69 years old.  She also noted some minor leg swelling, occasional palpitation. She denies any syncope, bleeding, orthopnea, PND  She stopped taking her atorvastatin 10 mg recently because of leg discomfort. She said that her leg discomfort improved. I encouraged her to try once again.   Past Medical History  Diagnosis Date  . Esophageal reflux   . Thyroid disease   . Hypothyroidism   . FH: colonic polyps   . Hyperlipidemia   . Hypercholesterolemia   . Osteopenia   . Vitiligo   . Cystocele   . Frequent headaches   . Recurrent UTI   . Asthma   . Skin cancer, basal cell   . Herpes labialis   . Lichen sclerosus     Past Surgical History  Procedure Laterality Date  . Bunionectomy    . Arthroscopic knee surgery    . Breast reduction  surgery    . Colposcopy    . Tvh    . Tubal ligation    . Tummy tuck    . Colonoscopy    . Abdominal hysterectomy      Outpatient Prescriptions Prior to Visit  Medication Sig Dispense Refill  . albuterol (PROVENTIL HFA;VENTOLIN HFA) 108 (90 BASE) MCG/ACT inhaler Inhale into the lungs every 6 (six) hours as needed for wheezing or shortness of breath.    Marland Kitchen atorvastatin (LIPITOR) 10 MG tablet Take 10 mg by mouth daily.    . Calcium Carbonate-Vitamin D (CALCIUM-VITAMIN D) 500-200 MG-UNIT per tablet Take 1 tablet by mouth daily.    Marland Kitchen esomeprazole (NEXIUM) 40 MG capsule Take 40 mg by mouth daily at 12 noon.    . fluticasone (FLONASE) 50 MCG/ACT nasal spray Place into both nostrils daily.    Marland Kitchen levothyroxine (SYNTHROID, LEVOTHROID) 112 MCG tablet Take 112 mcg by mouth daily before breakfast.    . montelukast (SINGULAIR) 10 MG tablet Take 10 mg by mouth at bedtime as needed.     . Multiple Vitamin (MULTIVITAMIN) capsule Take 1 capsule by mouth daily.    . Vitamin D, Ergocalciferol, (DRISDOL) 50000 UNITS CAPS capsule Take 50,000 Units by mouth every 7 (seven) days.    Marland Kitchen HYDROcodone-acetaminophen (NORCO) 5-325 MG per tablet Take 1-2 tablets by mouth every 6 (six) hours as needed.  12 tablet 0   No facility-administered medications prior to visit.     Allergies:   Codeine; Dolobid; Prednisone; Simvastatin; and Vicodin   Social History   Social History  . Marital Status: Divorced    Spouse Name: N/A  . Number of Children: N/A  . Years of Education: N/A   Social History Main Topics  . Smoking status: Never Smoker   . Smokeless tobacco: None  . Alcohol Use: None  . Drug Use: None  . Sexual Activity: Yes    Birth Control/ Protection: Inserts   Other Topics Concern  . None   Social History Narrative   she works at [employer] on by [employer]  Family History:  The patient's family history includes Alcohol abuse in her father; CAD in her mother; Heart disease in her father, mother,  and sister; Renal cancer in her mother; Rheumatic fever in her father.   ROS:   Please see the history of present illness.    ROS All other systems reviewed and are negative.   PHYSICAL EXAM:   VS:  BP 128/84 mmHg  Pulse 78  Ht 5' 0.5" (1.537 m)  Wt 171 lb 1.9 oz (77.62 kg)  BMI 32.86 kg/m2  SpO2 96%   GEN: Well nourished, well developed, in no acute distress HEENT: normal Neck: no JVD, carotid bruits, or masses Cardiac: RRR; 1/6 systolic murmur right upper sternal border, no rubs, or gallops,no edema  Respiratory:  clear to auscultation bilaterally, normal work of breathing GI: soft, nontender, nondistended, + BS MS: no deformity or atrophy Skin: warm and dry, no rash Neuro:  Alert and Oriented x 3, Strength and sensation are intact Psych: euthymic mood, full affect  Wt Readings from Last 3 Encounters:  03/08/15 171 lb 1.9 oz (77.62 kg)  04/24/14 160 lb (72.576 kg)      Studies/Labs Reviewed:   EKG:  04/24/14-sinus rhythm, nonspecific ST-T wave changes, heart rate 90 bpm, no significant change from prior, personally viewed.  Recent Labs: 04/24/2014: ALT 21; BUN 14; Creatinine, Ser 0.64; Hemoglobin 13.4; Platelets 265; Potassium 4.3; Sodium 136   Lipid Panel No results found for: CHOL, TRIG, HDL, CHOLHDL, VLDL, LDLCALC, LDLDIRECT   LDL 162, HDL 71, triglycerides 101, total cholesterol 253, TSH 2.3, ALT 15, potassium 4.4, creatinine 0.6, hemoglobin 13.2, platelets 264  Additional studies/ records that were reviewed today include:   04/24/14-CT angiogram of chest-no pulmonary embolism, no significant coronary artery calcification  Prior office notes reviewed, lab work reviewed, prior stress test reviewed   ASSESSMENT:    1. Chest pain, unspecified chest pain type   2. Other fatigue   3. Family history of early CAD   44. Hyperlipidemia   5. Heart murmur      PLAN:  In order of problems listed above:  1. We will go ahead and conduct a nuclear stress test. She has  a very strong family history of CAD. In fact, her son died of heart attack. I encouraged her to utilize her atorvastatin 10 mg. If she is noticing once again leg discomfort, she could consider changing to another agent such as simvastatin or perhaps Crestor. 2. Fatigue could be multifactorial. I want to make sure that there is no evidence of ischemia identified. 3. Son died MI. He was a smoker. 4. Encourage use of atorvastatin area for her, given her strong family history, I think that this is a excellent way to optimize prevention efforts. Last LDL 162. 5. We will check  echocardiogram. Possible source of murmur is aortic sclerosis. 6. If symptoms worsen or become more worrisome she can call us and let us know. Consider aspirin 81 mg for prevention efforts as well.    Medication Adjustments/Labs and Tests Ordered: Current medicines are reviewed at length with the patient today.  Concerns regarding medicines are outlined above.  Medication changes, Labs and Tests ordered today are listed in the Patient Instructions below. Patient Instructions  Medication Instructions:  The current medical regimen is effective;  continue present plan and medications.  Testing/Procedures: Your physician has requested that you have an echocardiogram. Echocardiography is a painless test that uses sound waves to create images of your heart. It provides your doctor with information about the size and shape of your heart and how well your heart's chambers and valves are working. This procedure takes approximately one hour. There are no restrictions for this procedure.  Your physician has requested that you have a lexiscan myoview. For further information please visit [email or web address]. Please follow instruction sheet, as given.  Follow-Up: Follow up as needed after testing.  If you need a refill on your cardiac medications before your next appointment, please call your pharmacy.  Thank you for choosing [facility]!!            Signed, Candee Furbish, MD  03/08/2015 3:05 PM    [facility] [place], [place], [place]  [address] Phone: [phone]; Fax: [phone]

## 2015-03-08 NOTE — Patient Instructions (Signed)
Medication Instructions:  The current medical regimen is effective;  continue present plan and medications.  Testing/Procedures: Your physician has requested that you have an echocardiogram. Echocardiography is a painless test that uses sound waves to create images of your heart. It provides your doctor with information about the size and shape of your heart and how well your heart's chambers and valves are working. This procedure takes approximately one hour. There are no restrictions for this procedure.  Your physician has requested that you have a lexiscan myoview. For further information please visit [email or web address]. Please follow instruction sheet, as given.  Follow-Up: Follow up as needed after testing.  If you need a refill on your cardiac medications before your next appointment, please call your pharmacy.  Thank you for choosing [facility]!!

## 2015-03-19 ENCOUNTER — Telehealth (HOSPITAL_COMMUNITY): Payer: Self-pay | Admitting: *Deleted

## 2015-03-19 NOTE — Telephone Encounter (Signed)
Left message on voicemail per DPR in reference to upcoming appointment scheduled on 03/21/15 at 0945 with detailed instructions given per Myocardial Perfusion Study Information Sheet for the test. LM to arrive 15 minutes early, and that it is imperative to arrive on time for appointment to keep from having the test rescheduled. If you need to cancel or reschedule your appointment, please call the office within 24 hours of your appointment. Failure to do so may result in a cancellation of your appointment, and a $50 no show fee. Phone number given for call back for any questions. Besnik Febus, Ranae Palms

## 2015-03-21 ENCOUNTER — Ambulatory Visit (HOSPITAL_BASED_OUTPATIENT_CLINIC_OR_DEPARTMENT_OTHER): Payer: BLUE CROSS/BLUE SHIELD

## 2015-03-21 ENCOUNTER — Ambulatory Visit (HOSPITAL_COMMUNITY): Payer: BLUE CROSS/BLUE SHIELD | Attending: Cardiovascular Disease

## 2015-03-21 ENCOUNTER — Other Ambulatory Visit: Payer: Self-pay

## 2015-03-21 DIAGNOSIS — R5383 Other fatigue: Secondary | ICD-10-CM | POA: Insufficient documentation

## 2015-03-21 DIAGNOSIS — I517 Cardiomegaly: Secondary | ICD-10-CM | POA: Diagnosis not present

## 2015-03-21 DIAGNOSIS — I351 Nonrheumatic aortic (valve) insufficiency: Secondary | ICD-10-CM | POA: Insufficient documentation

## 2015-03-21 DIAGNOSIS — R079 Chest pain, unspecified: Secondary | ICD-10-CM | POA: Diagnosis not present

## 2015-03-21 DIAGNOSIS — E7800 Pure hypercholesterolemia, unspecified: Secondary | ICD-10-CM | POA: Insufficient documentation

## 2015-03-21 DIAGNOSIS — I059 Rheumatic mitral valve disease, unspecified: Secondary | ICD-10-CM | POA: Diagnosis not present

## 2015-03-21 DIAGNOSIS — Z8249 Family history of ischemic heart disease and other diseases of the circulatory system: Secondary | ICD-10-CM | POA: Insufficient documentation

## 2015-03-21 LAB — MYOCARDIAL PERFUSION IMAGING
CHL CUP MPHR: 152 {beats}/min
CHL CUP NUCLEAR SSS: 9
CHL CUP RESTING HR STRESS: 70 {beats}/min
CSEPEDS: 0 s
Estimated workload: 4.6 METS
Exercise duration (min): 6 min
LV sys vol: 22 mL
LVDIAVOL: 73 mL
NUC STRESS TID: 0.82
Peak HR: 137 {beats}/min
Percent HR: 90 %
RATE: 0.3
SDS: 2
SRS: 7

## 2015-03-21 MED ORDER — TECHNETIUM TC 99M SESTAMIBI GENERIC - CARDIOLITE
10.3000 | Freq: Once | INTRAVENOUS | Status: AC | PRN
  Administered 2015-03-21: 10 via INTRAVENOUS

## 2015-03-21 MED ORDER — TECHNETIUM TC 99M SESTAMIBI GENERIC - CARDIOLITE
32.8000 | Freq: Once | INTRAVENOUS | Status: AC | PRN
Start: 2015-03-21 — End: 2015-03-21
  Administered 2015-03-21: 32.8 via INTRAVENOUS

## 2015-03-22 ENCOUNTER — Telehealth: Payer: Self-pay | Admitting: Cardiology

## 2015-03-22 NOTE — Telephone Encounter (Signed)
Pt notified of echo and myoview results by phone with verbal understanding. 

## 2015-03-22 NOTE — Telephone Encounter (Signed)
F/u  Pt returning phonecall- test results- can use work # before 5pm [phone], or mobile #- [phone] after 5pm. Please call back and discuss.

## 2015-03-22 NOTE — Telephone Encounter (Signed)
New message ° ° ° ° ° °Returning a call to the nurse to get test results °

## 2015-05-21 DIAGNOSIS — E7800 Pure hypercholesterolemia, unspecified: Secondary | ICD-10-CM | POA: Diagnosis not present

## 2015-05-21 DIAGNOSIS — B349 Viral infection, unspecified: Secondary | ICD-10-CM | POA: Diagnosis not present

## 2015-05-21 DIAGNOSIS — J0100 Acute maxillary sinusitis, unspecified: Secondary | ICD-10-CM | POA: Diagnosis not present

## 2015-06-06 DIAGNOSIS — J301 Allergic rhinitis due to pollen: Secondary | ICD-10-CM | POA: Diagnosis not present

## 2015-06-06 DIAGNOSIS — Z5181 Encounter for therapeutic drug level monitoring: Secondary | ICD-10-CM | POA: Diagnosis not present

## 2015-06-06 DIAGNOSIS — K219 Gastro-esophageal reflux disease without esophagitis: Secondary | ICD-10-CM | POA: Diagnosis not present

## 2015-06-07 ENCOUNTER — Ambulatory Visit (INDEPENDENT_AMBULATORY_CARE_PROVIDER_SITE_OTHER): Payer: BLUE CROSS/BLUE SHIELD

## 2015-06-07 ENCOUNTER — Ambulatory Visit (INDEPENDENT_AMBULATORY_CARE_PROVIDER_SITE_OTHER): Payer: BLUE CROSS/BLUE SHIELD | Admitting: Podiatry

## 2015-06-07 ENCOUNTER — Encounter: Payer: Self-pay | Admitting: Podiatry

## 2015-06-07 VITALS — BP 145/75 | HR 69 | Resp 12

## 2015-06-07 DIAGNOSIS — M79674 Pain in right toe(s): Secondary | ICD-10-CM

## 2015-06-07 DIAGNOSIS — M7751 Other enthesopathy of right foot: Secondary | ICD-10-CM | POA: Diagnosis not present

## 2015-06-07 DIAGNOSIS — M778 Other enthesopathies, not elsewhere classified: Secondary | ICD-10-CM

## 2015-06-07 DIAGNOSIS — M779 Enthesopathy, unspecified: Secondary | ICD-10-CM

## 2015-06-07 DIAGNOSIS — G5791 Unspecified mononeuropathy of right lower limb: Secondary | ICD-10-CM

## 2015-06-07 NOTE — Progress Notes (Signed)
   Subjective:    Patient ID: Toni Curry, female    DOB: 11-12-1945, 70 y.o.   MRN: [id number]  HPI: She presents today with a chief complaint of a five-month duration of pain to the fifth digit area of the right foot. She states it is then between the toes with a burning shooting pain and seems to have worsened over the past few weeks. Shoe gear and particularly seems to bother it.    Review of Systems  Cardiovascular: Positive for leg swelling.       Objective:   Physical Exam: Vital signs are stable she is alert and oriented 3 pulses are palpable. Neurologic sensorium is intact per Semmes-Weinstein monofilament deep tendon reflexes are intact and muscle strength +5 over 5 dorsiflexion plantar flexors and inverters and everters all intrinsic musculature is intact. Orthopedic evaluation demonstrates all joints distal to the ankle for range of motion without crepitation. Mild tailor's bunion deformity is resulting in juxtaposition of the fourth and fifth toes allowing for a pain across the fifth metatarsophalangeal joint and a hyperkeratotic lesion to the medial aspect of the PIPJ fifth digit right foot. This juxtaposition is creating inflammation and neuritis of the fifth toe. Radiographs confirm no signs of infection.        Assessment & Plan:  Neuritis and capsulitis with mild tailor bunion deformity right foot. Mild porokeratosis.  Plan: Injected the area today with dexamethasone and local anesthetic. Debrided reactive hyperkeratosis discussed padding and will follow-up with her on an as-needed basis.

## 2015-06-13 DIAGNOSIS — G4730 Sleep apnea, unspecified: Secondary | ICD-10-CM | POA: Diagnosis not present

## 2015-06-19 ENCOUNTER — Other Ambulatory Visit: Payer: Self-pay | Admitting: Gastroenterology

## 2015-06-19 DIAGNOSIS — D124 Benign neoplasm of descending colon: Secondary | ICD-10-CM | POA: Diagnosis not present

## 2015-06-19 DIAGNOSIS — K5730 Diverticulosis of large intestine without perforation or abscess without bleeding: Secondary | ICD-10-CM | POA: Diagnosis not present

## 2015-06-19 DIAGNOSIS — K621 Rectal polyp: Secondary | ICD-10-CM | POA: Diagnosis not present

## 2015-06-19 DIAGNOSIS — K6389 Other specified diseases of intestine: Secondary | ICD-10-CM | POA: Diagnosis not present

## 2015-06-19 DIAGNOSIS — Z1211 Encounter for screening for malignant neoplasm of colon: Secondary | ICD-10-CM | POA: Diagnosis not present

## 2015-06-19 DIAGNOSIS — K640 First degree hemorrhoids: Secondary | ICD-10-CM | POA: Diagnosis not present

## 2015-06-19 DIAGNOSIS — D126 Benign neoplasm of colon, unspecified: Secondary | ICD-10-CM | POA: Diagnosis not present

## 2015-07-20 DIAGNOSIS — E559 Vitamin D deficiency, unspecified: Secondary | ICD-10-CM | POA: Diagnosis not present

## 2015-08-16 DIAGNOSIS — G4733 Obstructive sleep apnea (adult) (pediatric): Secondary | ICD-10-CM | POA: Diagnosis not present

## 2015-08-24 DIAGNOSIS — J0110 Acute frontal sinusitis, unspecified: Secondary | ICD-10-CM | POA: Diagnosis not present

## 2015-08-28 DIAGNOSIS — Z01411 Encounter for gynecological examination (general) (routine) with abnormal findings: Secondary | ICD-10-CM | POA: Diagnosis not present

## 2015-09-07 DIAGNOSIS — G4733 Obstructive sleep apnea (adult) (pediatric): Secondary | ICD-10-CM | POA: Diagnosis not present

## 2015-09-20 DIAGNOSIS — G4733 Obstructive sleep apnea (adult) (pediatric): Secondary | ICD-10-CM | POA: Diagnosis not present

## 2015-10-21 DIAGNOSIS — G4733 Obstructive sleep apnea (adult) (pediatric): Secondary | ICD-10-CM | POA: Diagnosis not present

## 2015-10-25 DIAGNOSIS — J069 Acute upper respiratory infection, unspecified: Secondary | ICD-10-CM | POA: Diagnosis not present

## 2015-10-25 DIAGNOSIS — K219 Gastro-esophageal reflux disease without esophagitis: Secondary | ICD-10-CM | POA: Diagnosis not present

## 2015-10-25 DIAGNOSIS — B9789 Other viral agents as the cause of diseases classified elsewhere: Secondary | ICD-10-CM | POA: Diagnosis not present

## 2015-10-26 ENCOUNTER — Other Ambulatory Visit: Payer: Self-pay | Admitting: Family Medicine

## 2015-10-26 DIAGNOSIS — Z1231 Encounter for screening mammogram for malignant neoplasm of breast: Secondary | ICD-10-CM

## 2015-11-15 DIAGNOSIS — G4733 Obstructive sleep apnea (adult) (pediatric): Secondary | ICD-10-CM | POA: Diagnosis not present

## 2015-11-20 DIAGNOSIS — G4733 Obstructive sleep apnea (adult) (pediatric): Secondary | ICD-10-CM | POA: Diagnosis not present

## 2015-11-21 DIAGNOSIS — G4730 Sleep apnea, unspecified: Secondary | ICD-10-CM | POA: Diagnosis not present

## 2015-11-21 DIAGNOSIS — R0902 Hypoxemia: Secondary | ICD-10-CM | POA: Diagnosis not present

## 2015-11-22 ENCOUNTER — Ambulatory Visit
Admission: RE | Admit: 2015-11-22 | Discharge: 2015-11-22 | Disposition: A | Discharge status: Home / Self Care | Payer: BLUE CROSS/BLUE SHIELD | Source: Ambulatory Visit | Attending: Family Medicine | Admitting: Family Medicine

## 2015-11-22 DIAGNOSIS — Z1231 Encounter for screening mammogram for malignant neoplasm of breast: Secondary | ICD-10-CM

## 2015-12-13 DIAGNOSIS — G4730 Sleep apnea, unspecified: Secondary | ICD-10-CM | POA: Diagnosis not present

## 2015-12-18 DIAGNOSIS — H40011 Open angle with borderline findings, low risk, right eye: Secondary | ICD-10-CM | POA: Diagnosis not present

## 2016-01-01 DIAGNOSIS — L820 Inflamed seborrheic keratosis: Secondary | ICD-10-CM | POA: Diagnosis not present

## 2016-01-01 DIAGNOSIS — L821 Other seborrheic keratosis: Secondary | ICD-10-CM | POA: Diagnosis not present

## 2016-01-01 DIAGNOSIS — L814 Other melanin hyperpigmentation: Secondary | ICD-10-CM | POA: Diagnosis not present

## 2016-01-01 DIAGNOSIS — C44622 Squamous cell carcinoma of skin of right upper limb, including shoulder: Secondary | ICD-10-CM | POA: Diagnosis not present

## 2016-01-01 DIAGNOSIS — D235 Other benign neoplasm of skin of trunk: Secondary | ICD-10-CM | POA: Diagnosis not present

## 2016-01-07 DIAGNOSIS — Z0000 Encounter for general adult medical examination without abnormal findings: Secondary | ICD-10-CM | POA: Diagnosis not present

## 2016-01-07 DIAGNOSIS — E7800 Pure hypercholesterolemia, unspecified: Secondary | ICD-10-CM | POA: Diagnosis not present

## 2016-01-07 DIAGNOSIS — E039 Hypothyroidism, unspecified: Secondary | ICD-10-CM | POA: Diagnosis not present

## 2016-01-07 DIAGNOSIS — E559 Vitamin D deficiency, unspecified: Secondary | ICD-10-CM | POA: Diagnosis not present

## 2016-01-15 DIAGNOSIS — C44622 Squamous cell carcinoma of skin of right upper limb, including shoulder: Secondary | ICD-10-CM | POA: Diagnosis not present

## 2016-01-17 DIAGNOSIS — G4733 Obstructive sleep apnea (adult) (pediatric): Secondary | ICD-10-CM | POA: Diagnosis not present

## 2016-01-31 DIAGNOSIS — K219 Gastro-esophageal reflux disease without esophagitis: Secondary | ICD-10-CM | POA: Diagnosis not present

## 2016-02-21 DIAGNOSIS — Z85828 Personal history of other malignant neoplasm of skin: Secondary | ICD-10-CM | POA: Diagnosis not present

## 2016-02-21 DIAGNOSIS — L905 Scar conditions and fibrosis of skin: Secondary | ICD-10-CM | POA: Diagnosis not present

## 2016-02-21 DIAGNOSIS — L821 Other seborrheic keratosis: Secondary | ICD-10-CM | POA: Diagnosis not present

## 2016-03-12 DIAGNOSIS — M7061 Trochanteric bursitis, right hip: Secondary | ICD-10-CM | POA: Diagnosis not present

## 2016-03-25 DIAGNOSIS — M8589 Other specified disorders of bone density and structure, multiple sites: Secondary | ICD-10-CM | POA: Diagnosis not present

## 2016-04-25 DIAGNOSIS — K219 Gastro-esophageal reflux disease without esophagitis: Secondary | ICD-10-CM | POA: Diagnosis not present

## 2016-05-14 DIAGNOSIS — J069 Acute upper respiratory infection, unspecified: Secondary | ICD-10-CM | POA: Diagnosis not present

## 2016-05-21 DIAGNOSIS — G4733 Obstructive sleep apnea (adult) (pediatric): Secondary | ICD-10-CM | POA: Diagnosis not present

## 2016-05-22 DIAGNOSIS — Z85828 Personal history of other malignant neoplasm of skin: Secondary | ICD-10-CM | POA: Diagnosis not present

## 2016-05-22 DIAGNOSIS — L905 Scar conditions and fibrosis of skin: Secondary | ICD-10-CM | POA: Diagnosis not present

## 2016-07-01 DIAGNOSIS — N6310 Unspecified lump in the right breast, unspecified quadrant: Secondary | ICD-10-CM | POA: Diagnosis not present

## 2016-07-04 ENCOUNTER — Other Ambulatory Visit: Payer: Self-pay | Admitting: Family Medicine

## 2016-07-04 DIAGNOSIS — N6310 Unspecified lump in the right breast, unspecified quadrant: Secondary | ICD-10-CM

## 2016-07-08 ENCOUNTER — Other Ambulatory Visit: Payer: Self-pay | Admitting: Family Medicine

## 2016-07-08 ENCOUNTER — Ambulatory Visit
Admission: RE | Admit: 2016-07-08 | Discharge: 2016-07-08 | Disposition: A | Discharge status: Home / Self Care | Payer: BLUE CROSS/BLUE SHIELD | Source: Ambulatory Visit | Attending: Family Medicine | Admitting: Family Medicine

## 2016-07-08 DIAGNOSIS — M7989 Other specified soft tissue disorders: Secondary | ICD-10-CM

## 2016-07-08 DIAGNOSIS — R928 Other abnormal and inconclusive findings on diagnostic imaging of breast: Secondary | ICD-10-CM | POA: Diagnosis not present

## 2016-07-08 DIAGNOSIS — N6310 Unspecified lump in the right breast, unspecified quadrant: Secondary | ICD-10-CM

## 2016-07-08 DIAGNOSIS — N6489 Other specified disorders of breast: Secondary | ICD-10-CM | POA: Diagnosis not present

## 2016-08-06 DIAGNOSIS — K219 Gastro-esophageal reflux disease without esophagitis: Secondary | ICD-10-CM | POA: Diagnosis not present

## 2016-09-04 DIAGNOSIS — Z01411 Encounter for gynecological examination (general) (routine) with abnormal findings: Secondary | ICD-10-CM | POA: Diagnosis not present

## 2016-09-23 ENCOUNTER — Other Ambulatory Visit: Payer: Self-pay | Admitting: Dermatology

## 2016-09-23 DIAGNOSIS — L820 Inflamed seborrheic keratosis: Secondary | ICD-10-CM | POA: Diagnosis not present

## 2016-09-23 DIAGNOSIS — C44622 Squamous cell carcinoma of skin of right upper limb, including shoulder: Secondary | ICD-10-CM | POA: Diagnosis not present

## 2016-09-23 DIAGNOSIS — D0461 Carcinoma in situ of skin of right upper limb, including shoulder: Secondary | ICD-10-CM | POA: Diagnosis not present

## 2016-10-07 DIAGNOSIS — H40012 Open angle with borderline findings, low risk, left eye: Secondary | ICD-10-CM | POA: Diagnosis not present

## 2016-10-07 DIAGNOSIS — H40011 Open angle with borderline findings, low risk, right eye: Secondary | ICD-10-CM | POA: Diagnosis not present

## 2016-10-09 ENCOUNTER — Ambulatory Visit
Admission: RE | Admit: 2016-10-09 | Discharge: 2016-10-09 | Disposition: A | Discharge status: Home / Self Care | Payer: BLUE CROSS/BLUE SHIELD | Source: Ambulatory Visit | Attending: Family Medicine | Admitting: Family Medicine

## 2016-10-09 DIAGNOSIS — M7989 Other specified soft tissue disorders: Secondary | ICD-10-CM

## 2016-10-09 DIAGNOSIS — N6489 Other specified disorders of breast: Secondary | ICD-10-CM | POA: Diagnosis not present

## 2016-10-15 DIAGNOSIS — J0140 Acute pansinusitis, unspecified: Secondary | ICD-10-CM | POA: Diagnosis not present

## 2016-10-29 DIAGNOSIS — J069 Acute upper respiratory infection, unspecified: Secondary | ICD-10-CM | POA: Diagnosis not present

## 2016-10-29 DIAGNOSIS — K219 Gastro-esophageal reflux disease without esophagitis: Secondary | ICD-10-CM | POA: Diagnosis not present

## 2016-11-03 ENCOUNTER — Other Ambulatory Visit: Payer: Self-pay | Admitting: Family Medicine

## 2016-11-03 DIAGNOSIS — Z1231 Encounter for screening mammogram for malignant neoplasm of breast: Secondary | ICD-10-CM

## 2016-11-17 DIAGNOSIS — G4733 Obstructive sleep apnea (adult) (pediatric): Secondary | ICD-10-CM | POA: Diagnosis not present

## 2016-12-08 ENCOUNTER — Ambulatory Visit
Admission: RE | Admit: 2016-12-08 | Discharge: 2016-12-08 | Disposition: A | Discharge status: Home / Self Care | Payer: BLUE CROSS/BLUE SHIELD | Source: Ambulatory Visit | Attending: Family Medicine | Admitting: Family Medicine

## 2016-12-08 DIAGNOSIS — Z1231 Encounter for screening mammogram for malignant neoplasm of breast: Secondary | ICD-10-CM | POA: Diagnosis not present

## 2016-12-15 DIAGNOSIS — H40011 Open angle with borderline findings, low risk, right eye: Secondary | ICD-10-CM | POA: Diagnosis not present

## 2016-12-15 DIAGNOSIS — H2513 Age-related nuclear cataract, bilateral: Secondary | ICD-10-CM | POA: Diagnosis not present

## 2016-12-15 DIAGNOSIS — H40012 Open angle with borderline findings, low risk, left eye: Secondary | ICD-10-CM | POA: Diagnosis not present

## 2017-01-07 DIAGNOSIS — Z0000 Encounter for general adult medical examination without abnormal findings: Secondary | ICD-10-CM | POA: Diagnosis not present

## 2017-01-07 DIAGNOSIS — E559 Vitamin D deficiency, unspecified: Secondary | ICD-10-CM | POA: Diagnosis not present

## 2017-01-07 DIAGNOSIS — Z23 Encounter for immunization: Secondary | ICD-10-CM | POA: Diagnosis not present

## 2017-01-07 DIAGNOSIS — E7800 Pure hypercholesterolemia, unspecified: Secondary | ICD-10-CM | POA: Diagnosis not present

## 2017-01-07 DIAGNOSIS — K219 Gastro-esophageal reflux disease without esophagitis: Secondary | ICD-10-CM | POA: Diagnosis not present

## 2017-01-07 DIAGNOSIS — J4520 Mild intermittent asthma, uncomplicated: Secondary | ICD-10-CM | POA: Diagnosis not present

## 2017-01-29 DIAGNOSIS — K219 Gastro-esophageal reflux disease without esophagitis: Secondary | ICD-10-CM | POA: Diagnosis not present

## 2017-02-11 DIAGNOSIS — G4730 Sleep apnea, unspecified: Secondary | ICD-10-CM | POA: Diagnosis not present

## 2017-02-11 DIAGNOSIS — G4733 Obstructive sleep apnea (adult) (pediatric): Secondary | ICD-10-CM | POA: Diagnosis not present

## 2017-02-19 DIAGNOSIS — H40012 Open angle with borderline findings, low risk, left eye: Secondary | ICD-10-CM | POA: Diagnosis not present

## 2017-02-19 DIAGNOSIS — H40011 Open angle with borderline findings, low risk, right eye: Secondary | ICD-10-CM | POA: Diagnosis not present

## 2017-02-25 DIAGNOSIS — J069 Acute upper respiratory infection, unspecified: Secondary | ICD-10-CM | POA: Diagnosis not present

## 2017-02-25 DIAGNOSIS — J301 Allergic rhinitis due to pollen: Secondary | ICD-10-CM | POA: Diagnosis not present

## 2017-03-05 DIAGNOSIS — L814 Other melanin hyperpigmentation: Secondary | ICD-10-CM | POA: Diagnosis not present

## 2017-03-05 DIAGNOSIS — L821 Other seborrheic keratosis: Secondary | ICD-10-CM | POA: Diagnosis not present

## 2017-03-05 DIAGNOSIS — L570 Actinic keratosis: Secondary | ICD-10-CM | POA: Diagnosis not present

## 2017-04-24 DIAGNOSIS — R635 Abnormal weight gain: Secondary | ICD-10-CM | POA: Diagnosis not present

## 2017-04-24 DIAGNOSIS — N951 Menopausal and female climacteric states: Secondary | ICD-10-CM | POA: Diagnosis not present

## 2017-04-27 DIAGNOSIS — E782 Mixed hyperlipidemia: Secondary | ICD-10-CM | POA: Diagnosis not present

## 2017-04-27 DIAGNOSIS — Z1331 Encounter for screening for depression: Secondary | ICD-10-CM | POA: Diagnosis not present

## 2017-04-27 DIAGNOSIS — E039 Hypothyroidism, unspecified: Secondary | ICD-10-CM | POA: Diagnosis not present

## 2017-04-27 DIAGNOSIS — E559 Vitamin D deficiency, unspecified: Secondary | ICD-10-CM | POA: Diagnosis not present

## 2017-04-27 DIAGNOSIS — E669 Obesity, unspecified: Secondary | ICD-10-CM | POA: Diagnosis not present

## 2017-05-04 DIAGNOSIS — E782 Mixed hyperlipidemia: Secondary | ICD-10-CM | POA: Diagnosis not present

## 2017-05-04 DIAGNOSIS — R030 Elevated blood-pressure reading, without diagnosis of hypertension: Secondary | ICD-10-CM | POA: Diagnosis not present

## 2017-05-05 DIAGNOSIS — J209 Acute bronchitis, unspecified: Secondary | ICD-10-CM | POA: Diagnosis not present

## 2017-05-05 DIAGNOSIS — J45909 Unspecified asthma, uncomplicated: Secondary | ICD-10-CM | POA: Diagnosis not present

## 2017-05-11 DIAGNOSIS — Z8669 Personal history of other diseases of the nervous system and sense organs: Secondary | ICD-10-CM | POA: Diagnosis not present

## 2017-05-11 DIAGNOSIS — E782 Mixed hyperlipidemia: Secondary | ICD-10-CM | POA: Diagnosis not present

## 2017-05-14 DIAGNOSIS — N764 Abscess of vulva: Secondary | ICD-10-CM | POA: Diagnosis not present

## 2017-05-19 DIAGNOSIS — E782 Mixed hyperlipidemia: Secondary | ICD-10-CM | POA: Diagnosis not present

## 2017-05-19 DIAGNOSIS — G4700 Insomnia, unspecified: Secondary | ICD-10-CM | POA: Diagnosis not present

## 2017-05-25 DIAGNOSIS — H40011 Open angle with borderline findings, low risk, right eye: Secondary | ICD-10-CM | POA: Diagnosis not present

## 2017-05-25 DIAGNOSIS — H40012 Open angle with borderline findings, low risk, left eye: Secondary | ICD-10-CM | POA: Diagnosis not present

## 2017-05-26 DIAGNOSIS — E039 Hypothyroidism, unspecified: Secondary | ICD-10-CM | POA: Diagnosis not present

## 2017-05-26 DIAGNOSIS — N898 Other specified noninflammatory disorders of vagina: Secondary | ICD-10-CM | POA: Diagnosis not present

## 2017-05-26 DIAGNOSIS — N958 Other specified menopausal and perimenopausal disorders: Secondary | ICD-10-CM | POA: Diagnosis not present

## 2017-05-26 DIAGNOSIS — G4700 Insomnia, unspecified: Secondary | ICD-10-CM | POA: Diagnosis not present

## 2017-06-02 DIAGNOSIS — E782 Mixed hyperlipidemia: Secondary | ICD-10-CM | POA: Diagnosis not present

## 2017-06-02 DIAGNOSIS — R030 Elevated blood-pressure reading, without diagnosis of hypertension: Secondary | ICD-10-CM | POA: Diagnosis not present

## 2017-06-10 DIAGNOSIS — E782 Mixed hyperlipidemia: Secondary | ICD-10-CM | POA: Diagnosis not present

## 2017-06-11 DIAGNOSIS — K219 Gastro-esophageal reflux disease without esophagitis: Secondary | ICD-10-CM | POA: Diagnosis not present

## 2017-06-17 DIAGNOSIS — R030 Elevated blood-pressure reading, without diagnosis of hypertension: Secondary | ICD-10-CM | POA: Diagnosis not present

## 2017-06-23 DIAGNOSIS — Z713 Dietary counseling and surveillance: Secondary | ICD-10-CM | POA: Diagnosis not present

## 2017-06-23 DIAGNOSIS — R030 Elevated blood-pressure reading, without diagnosis of hypertension: Secondary | ICD-10-CM | POA: Diagnosis not present

## 2017-06-23 DIAGNOSIS — N958 Other specified menopausal and perimenopausal disorders: Secondary | ICD-10-CM | POA: Diagnosis not present

## 2017-06-23 DIAGNOSIS — E039 Hypothyroidism, unspecified: Secondary | ICD-10-CM | POA: Diagnosis not present

## 2017-08-03 DIAGNOSIS — K219 Gastro-esophageal reflux disease without esophagitis: Secondary | ICD-10-CM | POA: Diagnosis not present

## 2017-08-03 DIAGNOSIS — J069 Acute upper respiratory infection, unspecified: Secondary | ICD-10-CM | POA: Diagnosis not present

## 2017-08-18 DIAGNOSIS — M1711 Unilateral primary osteoarthritis, right knee: Secondary | ICD-10-CM | POA: Diagnosis not present

## 2017-08-18 DIAGNOSIS — S86911A Strain of unspecified muscle(s) and tendon(s) at lower leg level, right leg, initial encounter: Secondary | ICD-10-CM | POA: Diagnosis not present

## 2017-08-25 DIAGNOSIS — M170 Bilateral primary osteoarthritis of knee: Secondary | ICD-10-CM | POA: Diagnosis not present

## 2017-08-25 DIAGNOSIS — M1711 Unilateral primary osteoarthritis, right knee: Secondary | ICD-10-CM | POA: Diagnosis not present

## 2017-08-31 DIAGNOSIS — M1711 Unilateral primary osteoarthritis, right knee: Secondary | ICD-10-CM | POA: Diagnosis not present

## 2017-09-07 DIAGNOSIS — Z01411 Encounter for gynecological examination (general) (routine) with abnormal findings: Secondary | ICD-10-CM | POA: Diagnosis not present

## 2017-09-22 DIAGNOSIS — M25561 Pain in right knee: Secondary | ICD-10-CM | POA: Diagnosis not present

## 2017-09-22 DIAGNOSIS — M23321 Other meniscus derangements, posterior horn of medial meniscus, right knee: Secondary | ICD-10-CM | POA: Diagnosis not present

## 2017-09-29 DIAGNOSIS — M25561 Pain in right knee: Secondary | ICD-10-CM | POA: Diagnosis not present

## 2017-09-29 DIAGNOSIS — S83281A Other tear of lateral meniscus, current injury, right knee, initial encounter: Secondary | ICD-10-CM | POA: Diagnosis not present

## 2017-11-06 DIAGNOSIS — J45998 Other asthma: Secondary | ICD-10-CM | POA: Diagnosis not present

## 2017-11-06 DIAGNOSIS — K219 Gastro-esophageal reflux disease without esophagitis: Secondary | ICD-10-CM | POA: Diagnosis not present

## 2017-11-16 DIAGNOSIS — G4733 Obstructive sleep apnea (adult) (pediatric): Secondary | ICD-10-CM | POA: Diagnosis not present

## 2017-11-16 DIAGNOSIS — R030 Elevated blood-pressure reading, without diagnosis of hypertension: Secondary | ICD-10-CM | POA: Diagnosis not present

## 2017-11-17 ENCOUNTER — Other Ambulatory Visit: Payer: Self-pay | Admitting: Family Medicine

## 2017-11-17 DIAGNOSIS — G4733 Obstructive sleep apnea (adult) (pediatric): Secondary | ICD-10-CM | POA: Diagnosis not present

## 2017-11-17 DIAGNOSIS — G4730 Sleep apnea, unspecified: Secondary | ICD-10-CM | POA: Diagnosis not present

## 2017-11-17 DIAGNOSIS — Z1231 Encounter for screening mammogram for malignant neoplasm of breast: Secondary | ICD-10-CM

## 2017-11-24 ENCOUNTER — Ambulatory Visit: Payer: BLUE CROSS/BLUE SHIELD | Admitting: Cardiology

## 2017-11-24 ENCOUNTER — Encounter

## 2017-11-24 ENCOUNTER — Encounter: Payer: Self-pay | Admitting: Cardiology

## 2017-11-24 ENCOUNTER — Encounter: Payer: Self-pay | Admitting: *Deleted

## 2017-11-24 VITALS — BP 158/84 | HR 67 | Ht 60.0 in | Wt 175.4 lb

## 2017-11-24 DIAGNOSIS — R079 Chest pain, unspecified: Secondary | ICD-10-CM | POA: Diagnosis not present

## 2017-11-24 DIAGNOSIS — Z8249 Family history of ischemic heart disease and other diseases of the circulatory system: Secondary | ICD-10-CM

## 2017-11-24 DIAGNOSIS — R5383 Other fatigue: Secondary | ICD-10-CM

## 2017-11-24 DIAGNOSIS — Z01812 Encounter for preprocedural laboratory examination: Secondary | ICD-10-CM | POA: Diagnosis not present

## 2017-11-24 DIAGNOSIS — H401121 Primary open-angle glaucoma, left eye, mild stage: Secondary | ICD-10-CM | POA: Diagnosis not present

## 2017-11-24 DIAGNOSIS — R0609 Other forms of dyspnea: Secondary | ICD-10-CM

## 2017-11-24 MED ORDER — METOPROLOL TARTRATE 50 MG PO TABS: 50.0000 mg | ORAL_TABLET | Freq: Once | ORAL | 0 refills | Status: AC

## 2017-11-24 NOTE — Patient Instructions (Signed)
Medication Instructions:  The current medical regimen is effective;  continue present plan and medications.  If you need a refill on your cardiac medications before your next appointment, please call your pharmacy.   Lab work: Please have blood work prior to your CT scan. (BMP)  Testing/Procedures: Your physician has requested that you have an echocardiogram. Echocardiography is a painless test that uses sound waves to create images of your heart. It provides your doctor with information about the size and shape of your heart and how well your heart's chambers and valves are working. This procedure takes approximately one hour. There are no restrictions for this procedure.  Your physician has requested that you have [place]. Cardiac computed tomography (CT) is a painless test that uses an x-ray machine to take clear, detailed pictures of your heart.   Please follow instruction sheet as given.   Follow-Up: Follow up with Dr Marlou Porch based on the results of the above testing.  Thank you for choosing [facility]!!

## 2017-11-24 NOTE — Progress Notes (Signed)
Cardiology Office Note:    Date:  11/24/2017   ID:  Toni Curry, DOB 02-18-1945, MRN [id number]  PCP:  Shirline Frees, MD  Cardiologist:  No primary care provider on file.  Electrophysiologist:  None   Referring MD: Shirline Frees, MD    History of Present Illness:    Toni Curry is a 72 y.o. female here for follow-up of chest pain, fatigue.  Prior visit with me was on 03/08/2015 were she was describing extreme tiredness when walking and climbing stairs and some atypical chest discomfort mostly fleeting at night.  A nurse examined her at work and thought she heard a second heart sound.  She stopped taking atorvastatin 10 mg previously because of leg discomfort.  Her son who smoked, died of heart attack.  He was 8 when he had his first MI.  He enjoyed playing tennis.  Past few months more SOB, Dyspnea. Some chest tightness. Worry.   Past Medical History:  Diagnosis Date  . Asthma   . Cystocele   . Esophageal reflux   . FH: colonic polyps   . Frequent headaches   . Herpes labialis   . Hypercholesterolemia   . Hyperlipidemia   . Hypothyroidism   . Lichen sclerosus   . Osteopenia   . Recurrent UTI   . Skin cancer, basal cell   . Thyroid disease   . Vitiligo     Past Surgical History:  Procedure Laterality Date  . ABDOMINAL HYSTERECTOMY    . arthroscopic knee surgery    . BREAST REDUCTION SURGERY    . BUNIONECTOMY    . COLONOSCOPY    . COLPOSCOPY    . REDUCTION MAMMAPLASTY Bilateral 09/2004  . TUBAL LIGATION    . tummy tuck    . tvh      Current Medications: Current Meds  Medication Sig  . albuterol (PROVENTIL HFA;VENTOLIN HFA) 108 (90 BASE) MCG/ACT inhaler Inhale into the lungs every 6 (six) hours as needed for wheezing or shortness of breath.  Marland Kitchen atorvastatin (LIPITOR) 10 MG tablet Take 10 mg by mouth daily.  . Calcium Carbonate-Vitamin D (CALCIUM-VITAMIN D) 500-200 MG-UNIT per tablet Take 1 tablet by mouth daily.  Marland Kitchen esomeprazole (NEXIUM) 40 MG capsule Take  40 mg by mouth daily at 12 noon.  . fluticasone (FLONASE) 50 MCG/ACT nasal spray Place into both nostrils daily.  Marland Kitchen levothyroxine (SYNTHROID, LEVOTHROID) 112 MCG tablet Take 112 mcg by mouth daily before breakfast.  . montelukast (SINGULAIR) 10 MG tablet Take 10 mg by mouth at bedtime as needed.   . Multiple Vitamin (MULTIVITAMIN) capsule Take 1 capsule by mouth daily.  . Vitamin D, Ergocalciferol, (DRISDOL) 50000 UNITS CAPS capsule Take 50,000 Units by mouth every 7 (seven) days.     Allergies:   Codeine; Dolobid [diflunisal]; Prednisone; Simvastatin; and Vicodin [hydrocodone-acetaminophen]   Social History   Socioeconomic History  . Marital status: Divorced    Spouse name: Not on file  . Number of children: Not on file  . Years of education: Not on file  . Highest education level: Not on file  Occupational History  . Not on file  Social Needs  . Financial resource strain: Not on file  . Food insecurity:    Worry: Not on file    Inability: Not on file  . Transportation needs:    Medical: Not on file    Non-medical: Not on file  Tobacco Use  . Smoking status: Never Smoker  . Smokeless tobacco: Never Used  Substance and Sexual Activity  . Alcohol use: Not on file  . Drug use: Not on file  . Sexual activity: Yes    Birth control/protection: Inserts  Lifestyle  . Physical activity:    Days per week: Not on file    Minutes per session: Not on file  . Stress: Not on file  Relationships  . Social connections:    Talks on phone: Not on file    Gets together: Not on file    Attends religious service: Not on file    Active member of club or organization: Not on file    Attends meetings of clubs or organizations: Not on file    Relationship status: Not on file  Other Topics Concern  . Not on file  Social History Narrative  . Not on file     Family History: The patient's family history includes Alcohol abuse in her father; Breast cancer in her cousin; CAD in her mother;  Heart disease in her father, mother, and sister; Renal cancer in her mother; Rheumatic fever in her father.  ROS:   Please see the history of present illness.    Positive for shortness of breath chest pain waking up short of breath short of breath with activity, muscle pain chest pressure excessive fatigue all other systems reviewed and are negative.  EKGs/Labs/Other Studies Reviewed:    The following studies were reviewed today:  Nuclear stress test 03/21/2015:  Nuclear stress EF: 70%.  There was no ST segment deviation noted during stress. Exercise time 6:00  No perfusion defects at stress. Low risk study  - Left ventricle: The cavity size was normal. There was mild   concentric hypertrophy. Systolic function was normal. The   estimated ejection fraction was in the range of 55% to 60%. - Aortic valve: There was mild regurgitation. - Mitral valve: Calcified annulus. Mildly thickened leaflets . - Left atrium: The atrium was mildly dilated. - Atrial septum: There was increased thickness of the septum,   consistent with lipomatous hypertrophy. No defect or patent   foramen ovale was identified.  EKG:  EKG is ordered today.  The ekg ordered today demonstrates 11/24/2017-sinus rhythm 67 with no other abnormalities.  Recent Labs: No results found for requested labs within last 8760 hours.  Recent Lipid Panel No results found for: CHOL, TRIG, HDL, CHOLHDL, VLDL, LDLCALC, LDLDIRECT  Physical Exam:    VS:  BP (!) 158/84   Pulse 67   Ht 5' (1.524 m)   Wt 175 lb 6.4 oz (79.6 kg)   SpO2 94%   BMI 34.26 kg/m     Wt Readings from Last 3 Encounters:  11/24/17 175 lb 6.4 oz (79.6 kg)  03/21/15 171 lb (77.6 kg)  03/08/15 171 lb 1.9 oz (77.6 kg)     GEN: Overweight Well nourished, well developed in no acute distress HEENT: Normal NECK: No JVD; No carotid bruits LYMPHATICS: No lymphadenopathy CARDIAC: RRR, 1/6 SM, no rubs, gallops RESPIRATORY:  Clear to auscultation without rales,  wheezing or rhonchi  ABDOMEN: Soft, non-tender, non-distended MUSCULOSKELETAL:  No edema; No deformity  SKIN: Warm and dry NEUROLOGIC:  Alert and oriented x 3 PSYCHIATRIC:  Normal affect   ASSESSMENT:    1. Chest pain, unspecified type   2. Family history of early CAD   3. Other fatigue   4. Pre-procedure lab exam   5. Other form of dyspnea    PLAN:    In order of problems listed above:  Chest pain,  possible angina with shortness of breath with activity -Nuclear stress test in 2017 was overall low risk with no ischemia, however she is continuing to have escalation of her symptoms especially over the past 7 to 8 months.  Her nurse friend who works in cardiology noticed this on a couple trips that they went on and she is concerned.  We will go ahead and order her a CT scan with FFR analysis to hopefully exclude the possibility of flow-limiting coronary artery disease.  We will also check an echocardiogram to make sure that things have not changed in the past 2 years.  If these tests come back reassuring, certainly her shortness of breath may be affected by deconditioning, increased weight and we encouraged weight loss for her.  Family history of CAD - Son died of heart attack.  Fatigue -Likely multifactorial.  Hyperlipidemia - LDL 84 on atorvastatin 40.  Excellent.  Prior LDL 162.   Medication Adjustments/Labs and Tests Ordered: Current medicines are reviewed at length with the patient today.  Concerns regarding medicines are outlined above.  Orders Placed This Encounter  Procedures  . CT CORONARY MORPH W/CTA COR W/SCORE W/CA W/CM &/OR WO/CM  . CT CORONARY FRACTIONAL FLOW RESERVE DATA PREP  . CT CORONARY FRACTIONAL FLOW RESERVE FLUID ANALYSIS  . Basic metabolic panel  . EKG 12-Lead  . ECHOCARDIOGRAM COMPLETE   Meds ordered this encounter  Medications  . metoprolol tartrate (LOPRESSOR) 50 MG tablet    Sig: Take 1 tablet (50 mg total) by mouth once for 1 dose.    Dispense:   1 tablet    Refill:  0    Patient Instructions  Medication Instructions:  The current medical regimen is effective;  continue present plan and medications.  If you need a refill on your cardiac medications before your next appointment, please call your pharmacy.   Lab work: Please have blood work prior to your CT scan. (BMP)  Testing/Procedures: Your physician has requested that you have an echocardiogram. Echocardiography is a painless test that uses sound waves to create images of your heart. It provides your doctor with information about the size and shape of your heart and how well your heart's chambers and valves are working. This procedure takes approximately one hour. There are no restrictions for this procedure.  Your physician has requested that you have [place]. Cardiac computed tomography (CT) is a painless test that uses an x-ray machine to take clear, detailed pictures of your heart.   Please follow instruction sheet as given.   Follow-Up: Follow up with Dr Marlou Porch based on the results of the above testing.  Thank you for choosing [facility]!!        Signed, Candee Furbish, MD  11/24/2017 11:03 AM    [place]

## 2017-11-27 ENCOUNTER — Other Ambulatory Visit: Payer: Self-pay

## 2017-11-27 ENCOUNTER — Ambulatory Visit (HOSPITAL_COMMUNITY): Payer: BLUE CROSS/BLUE SHIELD | Attending: Cardiology

## 2017-11-27 DIAGNOSIS — R5383 Other fatigue: Secondary | ICD-10-CM | POA: Diagnosis not present

## 2017-11-27 DIAGNOSIS — R079 Chest pain, unspecified: Secondary | ICD-10-CM | POA: Diagnosis not present

## 2017-12-15 DIAGNOSIS — M1711 Unilateral primary osteoarthritis, right knee: Secondary | ICD-10-CM | POA: Diagnosis not present

## 2017-12-18 ENCOUNTER — Ambulatory Visit
Admission: RE | Admit: 2017-12-18 | Discharge: 2017-12-18 | Disposition: A | Discharge status: Home / Self Care | Payer: BLUE CROSS/BLUE SHIELD | Source: Ambulatory Visit | Attending: Family Medicine | Admitting: Family Medicine

## 2017-12-18 DIAGNOSIS — Z1231 Encounter for screening mammogram for malignant neoplasm of breast: Secondary | ICD-10-CM | POA: Diagnosis not present

## 2017-12-24 ENCOUNTER — Other Ambulatory Visit: Payer: BLUE CROSS/BLUE SHIELD | Admitting: *Deleted

## 2017-12-24 DIAGNOSIS — Z01812 Encounter for preprocedural laboratory examination: Secondary | ICD-10-CM

## 2017-12-24 DIAGNOSIS — R079 Chest pain, unspecified: Secondary | ICD-10-CM | POA: Diagnosis not present

## 2017-12-24 LAB — BASIC METABOLIC PANEL
BUN/Creatinine Ratio: 20 (ref 12–28)
BUN: 12 mg/dL (ref 8–27)
CHLORIDE: 98 mmol/L (ref 96–106)
CO2: 23 mmol/L (ref 20–29)
Calcium: 9.1 mg/dL (ref 8.7–10.3)
Creatinine, Ser: 0.61 mg/dL (ref 0.57–1.00)
GFR, EST AFRICAN AMERICAN: 105 mL/min/{1.73_m2} (ref >59)
GFR, EST NON AFRICAN AMERICAN: 92 mL/min/{1.73_m2} (ref >59)
Glucose: 77 mg/dL (ref 65–99)
POTASSIUM: 4.2 mmol/L (ref 3.5–5.2)
SODIUM: 137 mmol/L (ref 134–144)

## 2017-12-31 ENCOUNTER — Ambulatory Visit (HOSPITAL_COMMUNITY)
Admission: RE | Admit: 2017-12-31 | Discharge: 2017-12-31 | Disposition: A | Discharge status: Home / Self Care | Payer: BLUE CROSS/BLUE SHIELD | Source: Ambulatory Visit | Attending: Cardiology | Admitting: Cardiology

## 2017-12-31 DIAGNOSIS — R5383 Other fatigue: Secondary | ICD-10-CM

## 2017-12-31 DIAGNOSIS — Z8249 Family history of ischemic heart disease and other diseases of the circulatory system: Secondary | ICD-10-CM

## 2017-12-31 DIAGNOSIS — Z006 Encounter for examination for normal comparison and control in clinical research program: Secondary | ICD-10-CM

## 2017-12-31 DIAGNOSIS — R079 Chest pain, unspecified: Secondary | ICD-10-CM

## 2017-12-31 DIAGNOSIS — I2510 Atherosclerotic heart disease of native coronary artery without angina pectoris: Secondary | ICD-10-CM | POA: Insufficient documentation

## 2017-12-31 MED ORDER — METOPROLOL TARTRATE 5 MG/5ML IV SOLN
5.0000 mg | INTRAVENOUS | Status: DC | PRN
  Administered 2017-12-31 (×2): 5 mg via INTRAVENOUS
  Filled 2017-12-31: qty 5

## 2017-12-31 MED ORDER — METOPROLOL TARTRATE 5 MG/5ML IV SOLN
INTRAVENOUS | Status: AC
  Administered 2017-12-31: 5 mg
  Filled 2017-12-31: qty 5

## 2017-12-31 MED ORDER — NITROGLYCERIN 0.4 MG SL SUBL
SUBLINGUAL_TABLET | SUBLINGUAL | Status: AC
  Filled 2017-12-31: qty 2

## 2017-12-31 MED ORDER — METOPROLOL TARTRATE 5 MG/5ML IV SOLN
INTRAVENOUS | Status: AC
  Filled 2017-12-31: qty 5

## 2017-12-31 MED ORDER — IOPAMIDOL (ISOVUE-370) INJECTION 76%
100.0000 mL | Freq: Once | INTRAVENOUS | Status: AC | PRN
  Administered 2017-12-31: 100 mL via INTRAVENOUS

## 2017-12-31 MED ORDER — NITROGLYCERIN 0.4 MG SL SUBL
0.8000 mg | SUBLINGUAL_TABLET | Freq: Once | SUBLINGUAL | Status: AC
  Administered 2017-12-31: 0.8 mg via SUBLINGUAL
  Filled 2017-12-31: qty 25

## 2017-12-31 NOTE — Research (Signed)
Cadfem Informed Consent    Subject Name: Toni Curry   Subject met inclusion and exclusion criteria.  The informed consent form, study requirements and expectations were reviewed with the subject and questions and concerns were addressed prior to the signing of the consent form.  The subject verbalized understanding of the trail requirements.  The subject agreed to participate in the Cadfem trial and signed the informed consent.  The informed consent was obtained prior to performance of any protocol-specific procedures for the subject.  A copy of the signed informed consent was given to the subject and a copy was placed in the subject's medical record.   Antaniya Venuti S Rolin Schult  12-31-17 2:40 PM

## 2018-01-11 DIAGNOSIS — K219 Gastro-esophageal reflux disease without esophagitis: Secondary | ICD-10-CM | POA: Diagnosis not present

## 2018-01-11 DIAGNOSIS — S0501XA Injury of conjunctiva and corneal abrasion without foreign body, right eye, initial encounter: Secondary | ICD-10-CM | POA: Diagnosis not present

## 2018-01-11 DIAGNOSIS — E7800 Pure hypercholesterolemia, unspecified: Secondary | ICD-10-CM | POA: Diagnosis not present

## 2018-01-11 DIAGNOSIS — E559 Vitamin D deficiency, unspecified: Secondary | ICD-10-CM | POA: Diagnosis not present

## 2018-01-11 DIAGNOSIS — Z1159 Encounter for screening for other viral diseases: Secondary | ICD-10-CM | POA: Diagnosis not present

## 2018-01-11 DIAGNOSIS — H5711 Ocular pain, right eye: Secondary | ICD-10-CM | POA: Diagnosis not present

## 2018-01-11 DIAGNOSIS — Z0000 Encounter for general adult medical examination without abnormal findings: Secondary | ICD-10-CM | POA: Diagnosis not present

## 2018-01-13 DIAGNOSIS — H40013 Open angle with borderline findings, low risk, bilateral: Secondary | ICD-10-CM | POA: Diagnosis not present

## 2018-01-13 DIAGNOSIS — H16041 Marginal corneal ulcer, right eye: Secondary | ICD-10-CM | POA: Diagnosis not present

## 2018-01-16 DIAGNOSIS — H16041 Marginal corneal ulcer, right eye: Secondary | ICD-10-CM | POA: Diagnosis not present

## 2018-01-18 DIAGNOSIS — B0052 Herpesviral keratitis: Secondary | ICD-10-CM | POA: Diagnosis not present

## 2018-01-18 DIAGNOSIS — H16041 Marginal corneal ulcer, right eye: Secondary | ICD-10-CM | POA: Diagnosis not present

## 2018-01-18 DIAGNOSIS — H04123 Dry eye syndrome of bilateral lacrimal glands: Secondary | ICD-10-CM | POA: Diagnosis not present

## 2018-01-20 DIAGNOSIS — H04123 Dry eye syndrome of bilateral lacrimal glands: Secondary | ICD-10-CM | POA: Diagnosis not present

## 2018-01-20 DIAGNOSIS — B0052 Herpesviral keratitis: Secondary | ICD-10-CM | POA: Diagnosis not present

## 2018-02-03 DIAGNOSIS — B0052 Herpesviral keratitis: Secondary | ICD-10-CM | POA: Diagnosis not present

## 2018-02-03 DIAGNOSIS — H04123 Dry eye syndrome of bilateral lacrimal glands: Secondary | ICD-10-CM | POA: Diagnosis not present

## 2018-02-05 DIAGNOSIS — N3090 Cystitis, unspecified without hematuria: Secondary | ICD-10-CM | POA: Diagnosis not present

## 2018-02-05 DIAGNOSIS — K219 Gastro-esophageal reflux disease without esophagitis: Secondary | ICD-10-CM | POA: Diagnosis not present

## 2018-02-05 DIAGNOSIS — R300 Dysuria: Secondary | ICD-10-CM | POA: Diagnosis not present

## 2018-02-11 ENCOUNTER — Other Ambulatory Visit: Payer: Self-pay | Admitting: *Deleted

## 2018-02-11 DIAGNOSIS — Z79899 Other long term (current) drug therapy: Secondary | ICD-10-CM

## 2018-02-11 DIAGNOSIS — R931 Abnormal findings on diagnostic imaging of heart and coronary circulation: Secondary | ICD-10-CM

## 2018-02-11 MED ORDER — ROSUVASTATIN CALCIUM 20 MG PO TABS: 20.0000 mg | ORAL_TABLET | Freq: Every day | ORAL | 3 refills | Status: AC

## 2018-02-15 DIAGNOSIS — L820 Inflamed seborrheic keratosis: Secondary | ICD-10-CM | POA: Diagnosis not present

## 2018-02-15 DIAGNOSIS — L578 Other skin changes due to chronic exposure to nonionizing radiation: Secondary | ICD-10-CM | POA: Diagnosis not present

## 2018-02-15 DIAGNOSIS — D485 Neoplasm of uncertain behavior of skin: Secondary | ICD-10-CM | POA: Diagnosis not present

## 2018-02-15 DIAGNOSIS — L821 Other seborrheic keratosis: Secondary | ICD-10-CM | POA: Diagnosis not present

## 2018-04-21 ENCOUNTER — Other Ambulatory Visit: Payer: BLUE CROSS/BLUE SHIELD

## 2018-04-23 ENCOUNTER — Other Ambulatory Visit: Payer: BLUE CROSS/BLUE SHIELD

## 2018-04-27 ENCOUNTER — Other Ambulatory Visit: Payer: BLUE CROSS/BLUE SHIELD

## 2018-04-27 DIAGNOSIS — R931 Abnormal findings on diagnostic imaging of heart and coronary circulation: Secondary | ICD-10-CM

## 2018-04-27 DIAGNOSIS — Z79899 Other long term (current) drug therapy: Secondary | ICD-10-CM

## 2018-04-27 LAB — LIPID PANEL
CHOL/HDL RATIO: 2.5 ratio (ref 0.0–4.4)
CHOLESTEROL TOTAL: 157 mg/dL (ref 100–199)
HDL: 64 mg/dL (ref >39)
LDL CALC: 82 mg/dL (ref 0–99)
Triglycerides: 55 mg/dL (ref 0–149)
VLDL Cholesterol Cal: 11 mg/dL (ref 5–40)

## 2018-04-27 LAB — ALT: ALT: 21 IU/L (ref 0–32)

## 2018-05-10 DIAGNOSIS — K219 Gastro-esophageal reflux disease without esophagitis: Secondary | ICD-10-CM | POA: Diagnosis not present

## 2018-05-10 DIAGNOSIS — Z760 Encounter for issue of repeat prescription: Secondary | ICD-10-CM | POA: Diagnosis not present

## 2018-05-10 DIAGNOSIS — J45998 Other asthma: Secondary | ICD-10-CM | POA: Diagnosis not present

## 2018-06-24 DIAGNOSIS — H401121 Primary open-angle glaucoma, left eye, mild stage: Secondary | ICD-10-CM | POA: Diagnosis not present

## 2018-06-24 DIAGNOSIS — H40011 Open angle with borderline findings, low risk, right eye: Secondary | ICD-10-CM | POA: Diagnosis not present

## 2018-06-29 DIAGNOSIS — H40021 Open angle with borderline findings, high risk, right eye: Secondary | ICD-10-CM | POA: Diagnosis not present

## 2018-06-29 DIAGNOSIS — H401121 Primary open-angle glaucoma, left eye, mild stage: Secondary | ICD-10-CM | POA: Diagnosis not present

## 2018-06-29 DIAGNOSIS — H25812 Combined forms of age-related cataract, left eye: Secondary | ICD-10-CM | POA: Diagnosis not present

## 2018-06-29 DIAGNOSIS — H25811 Combined forms of age-related cataract, right eye: Secondary | ICD-10-CM | POA: Diagnosis not present

## 2018-08-02 DIAGNOSIS — Z760 Encounter for issue of repeat prescription: Secondary | ICD-10-CM | POA: Diagnosis not present

## 2018-08-02 DIAGNOSIS — K219 Gastro-esophageal reflux disease without esophagitis: Secondary | ICD-10-CM | POA: Diagnosis not present

## 2018-09-07 DIAGNOSIS — M1711 Unilateral primary osteoarthritis, right knee: Secondary | ICD-10-CM | POA: Diagnosis not present

## 2018-09-23 DIAGNOSIS — G4733 Obstructive sleep apnea (adult) (pediatric): Secondary | ICD-10-CM | POA: Diagnosis not present

## 2018-10-08 ENCOUNTER — Other Ambulatory Visit: Payer: Self-pay | Admitting: Obstetrics and Gynecology

## 2018-10-08 DIAGNOSIS — M810 Age-related osteoporosis without current pathological fracture: Secondary | ICD-10-CM

## 2018-10-08 DIAGNOSIS — Z1231 Encounter for screening mammogram for malignant neoplasm of breast: Secondary | ICD-10-CM

## 2018-10-08 DIAGNOSIS — Z01411 Encounter for gynecological examination (general) (routine) with abnormal findings: Secondary | ICD-10-CM | POA: Diagnosis not present

## 2018-10-21 DIAGNOSIS — Z760 Encounter for issue of repeat prescription: Secondary | ICD-10-CM | POA: Diagnosis not present

## 2018-10-21 DIAGNOSIS — J329 Chronic sinusitis, unspecified: Secondary | ICD-10-CM | POA: Diagnosis not present

## 2018-10-21 DIAGNOSIS — E785 Hyperlipidemia, unspecified: Secondary | ICD-10-CM | POA: Diagnosis not present

## 2018-10-21 DIAGNOSIS — K219 Gastro-esophageal reflux disease without esophagitis: Secondary | ICD-10-CM | POA: Diagnosis not present

## 2018-11-09 DIAGNOSIS — S83241A Other tear of medial meniscus, current injury, right knee, initial encounter: Secondary | ICD-10-CM | POA: Diagnosis not present

## 2018-11-09 DIAGNOSIS — Z01810 Encounter for preprocedural cardiovascular examination: Secondary | ICD-10-CM | POA: Diagnosis not present

## 2018-11-11 DIAGNOSIS — S83241D Other tear of medial meniscus, current injury, right knee, subsequent encounter: Secondary | ICD-10-CM | POA: Diagnosis not present

## 2018-11-11 DIAGNOSIS — Z01812 Encounter for preprocedural laboratory examination: Secondary | ICD-10-CM | POA: Diagnosis not present

## 2018-11-11 DIAGNOSIS — Z20828 Contact with and (suspected) exposure to other viral communicable diseases: Secondary | ICD-10-CM | POA: Diagnosis not present

## 2018-11-15 DIAGNOSIS — G4733 Obstructive sleep apnea (adult) (pediatric): Secondary | ICD-10-CM | POA: Diagnosis not present

## 2018-11-17 DIAGNOSIS — S83241A Other tear of medial meniscus, current injury, right knee, initial encounter: Secondary | ICD-10-CM | POA: Diagnosis not present

## 2018-11-17 DIAGNOSIS — M65861 Other synovitis and tenosynovitis, right lower leg: Secondary | ICD-10-CM | POA: Diagnosis not present

## 2018-11-17 DIAGNOSIS — X58XXXA Exposure to other specified factors, initial encounter: Secondary | ICD-10-CM | POA: Diagnosis not present

## 2018-11-23 DIAGNOSIS — S83241D Other tear of medial meniscus, current injury, right knee, subsequent encounter: Secondary | ICD-10-CM | POA: Diagnosis not present

## 2018-11-26 DIAGNOSIS — S83241D Other tear of medial meniscus, current injury, right knee, subsequent encounter: Secondary | ICD-10-CM | POA: Diagnosis not present

## 2018-11-30 DIAGNOSIS — R2689 Other abnormalities of gait and mobility: Secondary | ICD-10-CM | POA: Diagnosis not present

## 2018-12-02 DIAGNOSIS — S83241D Other tear of medial meniscus, current injury, right knee, subsequent encounter: Secondary | ICD-10-CM | POA: Diagnosis not present

## 2018-12-07 DIAGNOSIS — Z4789 Encounter for other orthopedic aftercare: Secondary | ICD-10-CM | POA: Diagnosis not present

## 2018-12-07 DIAGNOSIS — S83241D Other tear of medial meniscus, current injury, right knee, subsequent encounter: Secondary | ICD-10-CM | POA: Diagnosis not present

## 2018-12-16 DIAGNOSIS — S83241D Other tear of medial meniscus, current injury, right knee, subsequent encounter: Secondary | ICD-10-CM | POA: Diagnosis not present

## 2018-12-16 DIAGNOSIS — Z4789 Encounter for other orthopedic aftercare: Secondary | ICD-10-CM | POA: Diagnosis not present

## 2018-12-21 DIAGNOSIS — R2689 Other abnormalities of gait and mobility: Secondary | ICD-10-CM | POA: Diagnosis not present

## 2018-12-23 ENCOUNTER — Other Ambulatory Visit: Payer: Self-pay

## 2018-12-23 ENCOUNTER — Ambulatory Visit
Admission: RE | Admit: 2018-12-23 | Discharge: 2018-12-23 | Disposition: A | Discharge status: Home / Self Care | Payer: BC Managed Care – PPO | Source: Ambulatory Visit | Attending: Obstetrics and Gynecology | Admitting: Obstetrics and Gynecology

## 2018-12-23 DIAGNOSIS — M810 Age-related osteoporosis without current pathological fracture: Secondary | ICD-10-CM

## 2018-12-23 DIAGNOSIS — Z4789 Encounter for other orthopedic aftercare: Secondary | ICD-10-CM | POA: Diagnosis not present

## 2018-12-23 DIAGNOSIS — Z780 Asymptomatic menopausal state: Secondary | ICD-10-CM | POA: Diagnosis not present

## 2018-12-23 DIAGNOSIS — M8589 Other specified disorders of bone density and structure, multiple sites: Secondary | ICD-10-CM | POA: Diagnosis not present

## 2018-12-23 DIAGNOSIS — Z1231 Encounter for screening mammogram for malignant neoplasm of breast: Secondary | ICD-10-CM | POA: Diagnosis not present

## 2018-12-23 DIAGNOSIS — S83241D Other tear of medial meniscus, current injury, right knee, subsequent encounter: Secondary | ICD-10-CM | POA: Diagnosis not present

## 2018-12-28 DIAGNOSIS — S83241D Other tear of medial meniscus, current injury, right knee, subsequent encounter: Secondary | ICD-10-CM | POA: Diagnosis not present

## 2018-12-28 DIAGNOSIS — Z4789 Encounter for other orthopedic aftercare: Secondary | ICD-10-CM | POA: Diagnosis not present

## 2018-12-30 DIAGNOSIS — Z4789 Encounter for other orthopedic aftercare: Secondary | ICD-10-CM | POA: Diagnosis not present

## 2018-12-30 DIAGNOSIS — S83241D Other tear of medial meniscus, current injury, right knee, subsequent encounter: Secondary | ICD-10-CM | POA: Diagnosis not present

## 2019-01-04 DIAGNOSIS — Z4789 Encounter for other orthopedic aftercare: Secondary | ICD-10-CM | POA: Diagnosis not present

## 2019-01-04 DIAGNOSIS — S83241D Other tear of medial meniscus, current injury, right knee, subsequent encounter: Secondary | ICD-10-CM | POA: Diagnosis not present

## 2019-01-05 DIAGNOSIS — H40021 Open angle with borderline findings, high risk, right eye: Secondary | ICD-10-CM | POA: Diagnosis not present

## 2019-01-05 DIAGNOSIS — H401121 Primary open-angle glaucoma, left eye, mild stage: Secondary | ICD-10-CM | POA: Diagnosis not present

## 2019-01-06 DIAGNOSIS — S83241D Other tear of medial meniscus, current injury, right knee, subsequent encounter: Secondary | ICD-10-CM | POA: Diagnosis not present

## 2019-01-07 DIAGNOSIS — E785 Hyperlipidemia, unspecified: Secondary | ICD-10-CM | POA: Diagnosis not present

## 2019-01-07 DIAGNOSIS — J069 Acute upper respiratory infection, unspecified: Secondary | ICD-10-CM | POA: Diagnosis not present

## 2019-01-07 DIAGNOSIS — Z760 Encounter for issue of repeat prescription: Secondary | ICD-10-CM | POA: Diagnosis not present

## 2019-01-07 DIAGNOSIS — K219 Gastro-esophageal reflux disease without esophagitis: Secondary | ICD-10-CM | POA: Diagnosis not present

## 2019-01-11 DIAGNOSIS — S83241D Other tear of medial meniscus, current injury, right knee, subsequent encounter: Secondary | ICD-10-CM | POA: Diagnosis not present

## 2019-01-18 DIAGNOSIS — S83241D Other tear of medial meniscus, current injury, right knee, subsequent encounter: Secondary | ICD-10-CM | POA: Diagnosis not present

## 2019-01-25 DIAGNOSIS — S83241D Other tear of medial meniscus, current injury, right knee, subsequent encounter: Secondary | ICD-10-CM | POA: Diagnosis not present

## 2019-01-25 DIAGNOSIS — R2689 Other abnormalities of gait and mobility: Secondary | ICD-10-CM | POA: Diagnosis not present

## 2019-02-03 DIAGNOSIS — S83241D Other tear of medial meniscus, current injury, right knee, subsequent encounter: Secondary | ICD-10-CM | POA: Diagnosis not present

## 2019-02-03 DIAGNOSIS — R2689 Other abnormalities of gait and mobility: Secondary | ICD-10-CM | POA: Diagnosis not present

## 2019-02-04 DIAGNOSIS — H401121 Primary open-angle glaucoma, left eye, mild stage: Secondary | ICD-10-CM | POA: Diagnosis not present

## 2019-02-09 DIAGNOSIS — S83241D Other tear of medial meniscus, current injury, right knee, subsequent encounter: Secondary | ICD-10-CM | POA: Diagnosis not present

## 2019-02-16 DIAGNOSIS — Z4789 Encounter for other orthopedic aftercare: Secondary | ICD-10-CM | POA: Diagnosis not present

## 2019-02-16 DIAGNOSIS — S83241D Other tear of medial meniscus, current injury, right knee, subsequent encounter: Secondary | ICD-10-CM | POA: Diagnosis not present

## 2019-02-25 DIAGNOSIS — M1711 Unilateral primary osteoarthritis, right knee: Secondary | ICD-10-CM | POA: Diagnosis not present

## 2019-02-25 DIAGNOSIS — Z4789 Encounter for other orthopedic aftercare: Secondary | ICD-10-CM | POA: Diagnosis not present

## 2019-02-28 DIAGNOSIS — Z6834 Body mass index (BMI) 34.0-34.9, adult: Secondary | ICD-10-CM | POA: Diagnosis not present

## 2019-02-28 DIAGNOSIS — R0602 Shortness of breath: Secondary | ICD-10-CM | POA: Diagnosis not present

## 2019-02-28 DIAGNOSIS — E669 Obesity, unspecified: Secondary | ICD-10-CM | POA: Diagnosis not present

## 2019-03-10 DIAGNOSIS — J984 Other disorders of lung: Secondary | ICD-10-CM | POA: Diagnosis not present

## 2019-03-12 DIAGNOSIS — R0602 Shortness of breath: Secondary | ICD-10-CM | POA: Diagnosis not present

## 2019-03-23 DIAGNOSIS — M1711 Unilateral primary osteoarthritis, right knee: Secondary | ICD-10-CM | POA: Diagnosis not present

## 2019-03-23 DIAGNOSIS — M25561 Pain in right knee: Secondary | ICD-10-CM | POA: Diagnosis not present

## 2019-03-23 DIAGNOSIS — M25461 Effusion, right knee: Secondary | ICD-10-CM | POA: Diagnosis not present

## 2019-03-29 DIAGNOSIS — E669 Obesity, unspecified: Secondary | ICD-10-CM | POA: Diagnosis not present

## 2019-03-29 DIAGNOSIS — R0602 Shortness of breath: Secondary | ICD-10-CM | POA: Diagnosis not present

## 2019-04-05 DIAGNOSIS — H25811 Combined forms of age-related cataract, right eye: Secondary | ICD-10-CM | POA: Diagnosis not present

## 2019-04-05 DIAGNOSIS — H25812 Combined forms of age-related cataract, left eye: Secondary | ICD-10-CM | POA: Diagnosis not present

## 2019-04-05 DIAGNOSIS — H40021 Open angle with borderline findings, high risk, right eye: Secondary | ICD-10-CM | POA: Diagnosis not present

## 2019-04-05 DIAGNOSIS — H401121 Primary open-angle glaucoma, left eye, mild stage: Secondary | ICD-10-CM | POA: Diagnosis not present

## 2019-04-14 DIAGNOSIS — L905 Scar conditions and fibrosis of skin: Secondary | ICD-10-CM | POA: Diagnosis not present

## 2019-04-14 DIAGNOSIS — D225 Melanocytic nevi of trunk: Secondary | ICD-10-CM | POA: Diagnosis not present

## 2019-04-14 DIAGNOSIS — R208 Other disturbances of skin sensation: Secondary | ICD-10-CM | POA: Diagnosis not present

## 2019-04-14 DIAGNOSIS — L570 Actinic keratosis: Secondary | ICD-10-CM | POA: Diagnosis not present

## 2019-04-14 DIAGNOSIS — D1801 Hemangioma of skin and subcutaneous tissue: Secondary | ICD-10-CM | POA: Diagnosis not present

## 2019-04-14 DIAGNOSIS — L821 Other seborrheic keratosis: Secondary | ICD-10-CM | POA: Diagnosis not present

## 2019-04-14 DIAGNOSIS — L820 Inflamed seborrheic keratosis: Secondary | ICD-10-CM | POA: Diagnosis not present

## 2019-06-17 DIAGNOSIS — E559 Vitamin D deficiency, unspecified: Secondary | ICD-10-CM | POA: Diagnosis not present

## 2019-06-17 DIAGNOSIS — Z0000 Encounter for general adult medical examination without abnormal findings: Secondary | ICD-10-CM | POA: Diagnosis not present

## 2019-06-17 DIAGNOSIS — E039 Hypothyroidism, unspecified: Secondary | ICD-10-CM | POA: Diagnosis not present

## 2019-06-17 DIAGNOSIS — N63 Unspecified lump in unspecified breast: Secondary | ICD-10-CM | POA: Diagnosis not present

## 2019-06-17 DIAGNOSIS — E7800 Pure hypercholesterolemia, unspecified: Secondary | ICD-10-CM | POA: Diagnosis not present

## 2019-06-17 DIAGNOSIS — Z23 Encounter for immunization: Secondary | ICD-10-CM | POA: Diagnosis not present

## 2019-06-20 ENCOUNTER — Other Ambulatory Visit: Payer: Self-pay | Admitting: Family Medicine

## 2019-06-20 DIAGNOSIS — N63 Unspecified lump in unspecified breast: Secondary | ICD-10-CM

## 2019-06-21 ENCOUNTER — Ambulatory Visit
Admission: RE | Admit: 2019-06-21 | Discharge: 2019-06-21 | Disposition: A | Discharge status: Home / Self Care | Payer: BC Managed Care – PPO | Source: Ambulatory Visit | Attending: Family Medicine | Admitting: Family Medicine

## 2019-06-21 ENCOUNTER — Other Ambulatory Visit: Payer: Self-pay

## 2019-06-21 DIAGNOSIS — N63 Unspecified lump in unspecified breast: Secondary | ICD-10-CM

## 2019-06-21 DIAGNOSIS — N6310 Unspecified lump in the right breast, unspecified quadrant: Secondary | ICD-10-CM | POA: Diagnosis not present

## 2019-06-21 DIAGNOSIS — R928 Other abnormal and inconclusive findings on diagnostic imaging of breast: Secondary | ICD-10-CM | POA: Diagnosis not present

## 2019-06-22 DIAGNOSIS — M1711 Unilateral primary osteoarthritis, right knee: Secondary | ICD-10-CM | POA: Diagnosis not present

## 2019-08-09 DIAGNOSIS — H25813 Combined forms of age-related cataract, bilateral: Secondary | ICD-10-CM | POA: Diagnosis not present

## 2019-08-09 DIAGNOSIS — H401121 Primary open-angle glaucoma, left eye, mild stage: Secondary | ICD-10-CM | POA: Diagnosis not present

## 2019-08-09 DIAGNOSIS — H40021 Open angle with borderline findings, high risk, right eye: Secondary | ICD-10-CM | POA: Diagnosis not present

## 2019-09-13 DIAGNOSIS — Z23 Encounter for immunization: Secondary | ICD-10-CM | POA: Diagnosis not present

## 2019-10-14 DIAGNOSIS — N951 Menopausal and female climacteric states: Secondary | ICD-10-CM | POA: Diagnosis not present

## 2019-10-14 DIAGNOSIS — L900 Lichen sclerosus et atrophicus: Secondary | ICD-10-CM | POA: Diagnosis not present

## 2019-11-17 DIAGNOSIS — G4733 Obstructive sleep apnea (adult) (pediatric): Secondary | ICD-10-CM | POA: Diagnosis not present

## 2019-11-21 ENCOUNTER — Other Ambulatory Visit: Payer: Self-pay | Admitting: Family Medicine

## 2019-11-21 DIAGNOSIS — Z1231 Encounter for screening mammogram for malignant neoplasm of breast: Secondary | ICD-10-CM

## 2019-11-23 DIAGNOSIS — Z885 Allergy status to narcotic agent status: Secondary | ICD-10-CM | POA: Diagnosis not present

## 2019-11-23 DIAGNOSIS — M25561 Pain in right knee: Secondary | ICD-10-CM | POA: Diagnosis not present

## 2019-11-23 DIAGNOSIS — M1711 Unilateral primary osteoarthritis, right knee: Secondary | ICD-10-CM | POA: Diagnosis not present

## 2019-11-23 DIAGNOSIS — Z888 Allergy status to other drugs, medicaments and biological substances status: Secondary | ICD-10-CM | POA: Diagnosis not present

## 2019-11-29 DIAGNOSIS — Z01812 Encounter for preprocedural laboratory examination: Secondary | ICD-10-CM | POA: Diagnosis not present

## 2019-11-29 DIAGNOSIS — Z20822 Contact with and (suspected) exposure to covid-19: Secondary | ICD-10-CM | POA: Diagnosis not present

## 2019-11-29 DIAGNOSIS — M1711 Unilateral primary osteoarthritis, right knee: Secondary | ICD-10-CM | POA: Diagnosis not present

## 2019-12-06 DIAGNOSIS — Z9989 Dependence on other enabling machines and devices: Secondary | ICD-10-CM | POA: Diagnosis not present

## 2019-12-06 DIAGNOSIS — K219 Gastro-esophageal reflux disease without esophagitis: Secondary | ICD-10-CM | POA: Diagnosis not present

## 2019-12-06 DIAGNOSIS — Z96651 Presence of right artificial knee joint: Secondary | ICD-10-CM | POA: Diagnosis not present

## 2019-12-06 DIAGNOSIS — Z79899 Other long term (current) drug therapy: Secondary | ICD-10-CM | POA: Diagnosis not present

## 2019-12-06 DIAGNOSIS — M25561 Pain in right knee: Secondary | ICD-10-CM | POA: Diagnosis not present

## 2019-12-06 DIAGNOSIS — Z471 Aftercare following joint replacement surgery: Secondary | ICD-10-CM | POA: Diagnosis not present

## 2019-12-06 DIAGNOSIS — E039 Hypothyroidism, unspecified: Secondary | ICD-10-CM | POA: Diagnosis not present

## 2019-12-06 DIAGNOSIS — Z7409 Other reduced mobility: Secondary | ICD-10-CM | POA: Diagnosis not present

## 2019-12-06 DIAGNOSIS — M1711 Unilateral primary osteoarthritis, right knee: Secondary | ICD-10-CM | POA: Diagnosis not present

## 2019-12-06 DIAGNOSIS — G8918 Other acute postprocedural pain: Secondary | ICD-10-CM | POA: Diagnosis not present

## 2019-12-06 DIAGNOSIS — G4733 Obstructive sleep apnea (adult) (pediatric): Secondary | ICD-10-CM | POA: Diagnosis not present

## 2019-12-06 DIAGNOSIS — Z7982 Long term (current) use of aspirin: Secondary | ICD-10-CM | POA: Diagnosis not present

## 2019-12-07 DIAGNOSIS — M25561 Pain in right knee: Secondary | ICD-10-CM | POA: Diagnosis not present

## 2019-12-07 DIAGNOSIS — G4733 Obstructive sleep apnea (adult) (pediatric): Secondary | ICD-10-CM | POA: Diagnosis not present

## 2019-12-07 DIAGNOSIS — Z7982 Long term (current) use of aspirin: Secondary | ICD-10-CM | POA: Diagnosis not present

## 2019-12-07 DIAGNOSIS — Z79899 Other long term (current) drug therapy: Secondary | ICD-10-CM | POA: Diagnosis not present

## 2019-12-07 DIAGNOSIS — M1711 Unilateral primary osteoarthritis, right knee: Secondary | ICD-10-CM | POA: Diagnosis not present

## 2019-12-07 DIAGNOSIS — Z9989 Dependence on other enabling machines and devices: Secondary | ICD-10-CM | POA: Diagnosis not present

## 2019-12-07 DIAGNOSIS — K219 Gastro-esophageal reflux disease without esophagitis: Secondary | ICD-10-CM | POA: Diagnosis not present

## 2019-12-07 DIAGNOSIS — Z7409 Other reduced mobility: Secondary | ICD-10-CM | POA: Diagnosis not present

## 2019-12-07 DIAGNOSIS — E039 Hypothyroidism, unspecified: Secondary | ICD-10-CM | POA: Diagnosis not present

## 2019-12-08 DIAGNOSIS — R2689 Other abnormalities of gait and mobility: Secondary | ICD-10-CM | POA: Diagnosis not present

## 2019-12-08 DIAGNOSIS — G8918 Other acute postprocedural pain: Secondary | ICD-10-CM | POA: Diagnosis not present

## 2019-12-08 DIAGNOSIS — M1711 Unilateral primary osteoarthritis, right knee: Secondary | ICD-10-CM | POA: Diagnosis not present

## 2019-12-08 DIAGNOSIS — R262 Difficulty in walking, not elsewhere classified: Secondary | ICD-10-CM | POA: Diagnosis not present

## 2019-12-12 DIAGNOSIS — S83241D Other tear of medial meniscus, current injury, right knee, subsequent encounter: Secondary | ICD-10-CM | POA: Diagnosis not present

## 2019-12-12 DIAGNOSIS — R2689 Other abnormalities of gait and mobility: Secondary | ICD-10-CM | POA: Diagnosis not present

## 2019-12-12 DIAGNOSIS — G8918 Other acute postprocedural pain: Secondary | ICD-10-CM | POA: Diagnosis not present

## 2019-12-12 DIAGNOSIS — R262 Difficulty in walking, not elsewhere classified: Secondary | ICD-10-CM | POA: Diagnosis not present

## 2019-12-14 DIAGNOSIS — R2689 Other abnormalities of gait and mobility: Secondary | ICD-10-CM | POA: Diagnosis not present

## 2019-12-14 DIAGNOSIS — G8918 Other acute postprocedural pain: Secondary | ICD-10-CM | POA: Diagnosis not present

## 2019-12-14 DIAGNOSIS — R262 Difficulty in walking, not elsewhere classified: Secondary | ICD-10-CM | POA: Diagnosis not present

## 2019-12-14 DIAGNOSIS — S83241D Other tear of medial meniscus, current injury, right knee, subsequent encounter: Secondary | ICD-10-CM | POA: Diagnosis not present

## 2019-12-20 DIAGNOSIS — R262 Difficulty in walking, not elsewhere classified: Secondary | ICD-10-CM | POA: Diagnosis not present

## 2019-12-20 DIAGNOSIS — S83241D Other tear of medial meniscus, current injury, right knee, subsequent encounter: Secondary | ICD-10-CM | POA: Diagnosis not present

## 2019-12-20 DIAGNOSIS — G8918 Other acute postprocedural pain: Secondary | ICD-10-CM | POA: Diagnosis not present

## 2019-12-20 DIAGNOSIS — R2689 Other abnormalities of gait and mobility: Secondary | ICD-10-CM | POA: Diagnosis not present

## 2019-12-22 DIAGNOSIS — G4733 Obstructive sleep apnea (adult) (pediatric): Secondary | ICD-10-CM | POA: Diagnosis not present

## 2019-12-23 DIAGNOSIS — S83241D Other tear of medial meniscus, current injury, right knee, subsequent encounter: Secondary | ICD-10-CM | POA: Diagnosis not present

## 2019-12-23 DIAGNOSIS — R262 Difficulty in walking, not elsewhere classified: Secondary | ICD-10-CM | POA: Diagnosis not present

## 2019-12-23 DIAGNOSIS — G8918 Other acute postprocedural pain: Secondary | ICD-10-CM | POA: Diagnosis not present

## 2019-12-23 DIAGNOSIS — R2689 Other abnormalities of gait and mobility: Secondary | ICD-10-CM | POA: Diagnosis not present

## 2019-12-28 DIAGNOSIS — R2689 Other abnormalities of gait and mobility: Secondary | ICD-10-CM | POA: Diagnosis not present

## 2019-12-28 DIAGNOSIS — S83241D Other tear of medial meniscus, current injury, right knee, subsequent encounter: Secondary | ICD-10-CM | POA: Diagnosis not present

## 2019-12-28 DIAGNOSIS — R262 Difficulty in walking, not elsewhere classified: Secondary | ICD-10-CM | POA: Diagnosis not present

## 2019-12-28 DIAGNOSIS — G8918 Other acute postprocedural pain: Secondary | ICD-10-CM | POA: Diagnosis not present

## 2019-12-30 DIAGNOSIS — R2689 Other abnormalities of gait and mobility: Secondary | ICD-10-CM | POA: Diagnosis not present

## 2019-12-30 DIAGNOSIS — G8918 Other acute postprocedural pain: Secondary | ICD-10-CM | POA: Diagnosis not present

## 2019-12-30 DIAGNOSIS — S83241D Other tear of medial meniscus, current injury, right knee, subsequent encounter: Secondary | ICD-10-CM | POA: Diagnosis not present

## 2019-12-30 DIAGNOSIS — R262 Difficulty in walking, not elsewhere classified: Secondary | ICD-10-CM | POA: Diagnosis not present

## 2020-01-03 DIAGNOSIS — S83241D Other tear of medial meniscus, current injury, right knee, subsequent encounter: Secondary | ICD-10-CM | POA: Diagnosis not present

## 2020-01-03 DIAGNOSIS — R262 Difficulty in walking, not elsewhere classified: Secondary | ICD-10-CM | POA: Diagnosis not present

## 2020-01-03 DIAGNOSIS — R2689 Other abnormalities of gait and mobility: Secondary | ICD-10-CM | POA: Diagnosis not present

## 2020-01-03 DIAGNOSIS — G8918 Other acute postprocedural pain: Secondary | ICD-10-CM | POA: Diagnosis not present

## 2020-01-06 DIAGNOSIS — R2689 Other abnormalities of gait and mobility: Secondary | ICD-10-CM | POA: Diagnosis not present

## 2020-01-06 DIAGNOSIS — G8918 Other acute postprocedural pain: Secondary | ICD-10-CM | POA: Diagnosis not present

## 2020-01-06 DIAGNOSIS — R262 Difficulty in walking, not elsewhere classified: Secondary | ICD-10-CM | POA: Diagnosis not present

## 2020-01-06 DIAGNOSIS — S83241D Other tear of medial meniscus, current injury, right knee, subsequent encounter: Secondary | ICD-10-CM | POA: Diagnosis not present

## 2020-01-09 ENCOUNTER — Ambulatory Visit
Admission: RE | Admit: 2020-01-09 | Discharge: 2020-01-09 | Disposition: A | Discharge status: Home / Self Care | Payer: BC Managed Care – PPO | Source: Ambulatory Visit | Attending: Family Medicine | Admitting: Family Medicine

## 2020-01-09 ENCOUNTER — Other Ambulatory Visit: Payer: Self-pay

## 2020-01-09 DIAGNOSIS — Z1231 Encounter for screening mammogram for malignant neoplasm of breast: Secondary | ICD-10-CM | POA: Diagnosis not present

## 2020-01-09 DIAGNOSIS — G8918 Other acute postprocedural pain: Secondary | ICD-10-CM | POA: Diagnosis not present

## 2020-01-09 DIAGNOSIS — R2689 Other abnormalities of gait and mobility: Secondary | ICD-10-CM | POA: Diagnosis not present

## 2020-01-09 DIAGNOSIS — S83241D Other tear of medial meniscus, current injury, right knee, subsequent encounter: Secondary | ICD-10-CM | POA: Diagnosis not present

## 2020-01-09 DIAGNOSIS — R262 Difficulty in walking, not elsewhere classified: Secondary | ICD-10-CM | POA: Diagnosis not present

## 2020-01-11 DIAGNOSIS — R2689 Other abnormalities of gait and mobility: Secondary | ICD-10-CM | POA: Diagnosis not present

## 2020-01-11 DIAGNOSIS — G8918 Other acute postprocedural pain: Secondary | ICD-10-CM | POA: Diagnosis not present

## 2020-01-11 DIAGNOSIS — R262 Difficulty in walking, not elsewhere classified: Secondary | ICD-10-CM | POA: Diagnosis not present

## 2020-01-11 DIAGNOSIS — S83241D Other tear of medial meniscus, current injury, right knee, subsequent encounter: Secondary | ICD-10-CM | POA: Diagnosis not present

## 2020-01-17 DIAGNOSIS — G8918 Other acute postprocedural pain: Secondary | ICD-10-CM | POA: Diagnosis not present

## 2020-01-17 DIAGNOSIS — R262 Difficulty in walking, not elsewhere classified: Secondary | ICD-10-CM | POA: Diagnosis not present

## 2020-01-17 DIAGNOSIS — S83241D Other tear of medial meniscus, current injury, right knee, subsequent encounter: Secondary | ICD-10-CM | POA: Diagnosis not present

## 2020-01-17 DIAGNOSIS — R2689 Other abnormalities of gait and mobility: Secondary | ICD-10-CM | POA: Diagnosis not present

## 2020-01-19 DIAGNOSIS — M25461 Effusion, right knee: Secondary | ICD-10-CM | POA: Diagnosis not present

## 2020-01-19 DIAGNOSIS — R262 Difficulty in walking, not elsewhere classified: Secondary | ICD-10-CM | POA: Diagnosis not present

## 2020-01-19 DIAGNOSIS — R2689 Other abnormalities of gait and mobility: Secondary | ICD-10-CM | POA: Diagnosis not present

## 2020-01-19 DIAGNOSIS — G8918 Other acute postprocedural pain: Secondary | ICD-10-CM | POA: Diagnosis not present

## 2020-01-19 DIAGNOSIS — Z471 Aftercare following joint replacement surgery: Secondary | ICD-10-CM | POA: Diagnosis not present

## 2020-01-19 DIAGNOSIS — Z96651 Presence of right artificial knee joint: Secondary | ICD-10-CM | POA: Diagnosis not present

## 2020-01-19 DIAGNOSIS — S83241D Other tear of medial meniscus, current injury, right knee, subsequent encounter: Secondary | ICD-10-CM | POA: Diagnosis not present

## 2020-01-24 DIAGNOSIS — Z96651 Presence of right artificial knee joint: Secondary | ICD-10-CM | POA: Diagnosis not present

## 2020-01-24 DIAGNOSIS — Z01812 Encounter for preprocedural laboratory examination: Secondary | ICD-10-CM | POA: Diagnosis not present

## 2020-01-24 DIAGNOSIS — R262 Difficulty in walking, not elsewhere classified: Secondary | ICD-10-CM | POA: Diagnosis not present

## 2020-01-24 DIAGNOSIS — S83241D Other tear of medial meniscus, current injury, right knee, subsequent encounter: Secondary | ICD-10-CM | POA: Diagnosis not present

## 2020-01-24 DIAGNOSIS — G8918 Other acute postprocedural pain: Secondary | ICD-10-CM | POA: Diagnosis not present

## 2020-01-24 DIAGNOSIS — R2689 Other abnormalities of gait and mobility: Secondary | ICD-10-CM | POA: Diagnosis not present

## 2020-01-24 DIAGNOSIS — Z20822 Contact with and (suspected) exposure to covid-19: Secondary | ICD-10-CM | POA: Diagnosis not present

## 2020-01-24 DIAGNOSIS — M24661 Ankylosis, right knee: Secondary | ICD-10-CM | POA: Diagnosis not present

## 2020-01-26 DIAGNOSIS — R2689 Other abnormalities of gait and mobility: Secondary | ICD-10-CM | POA: Diagnosis not present

## 2020-01-26 DIAGNOSIS — G8918 Other acute postprocedural pain: Secondary | ICD-10-CM | POA: Diagnosis not present

## 2020-01-26 DIAGNOSIS — S83241D Other tear of medial meniscus, current injury, right knee, subsequent encounter: Secondary | ICD-10-CM | POA: Diagnosis not present

## 2020-01-26 DIAGNOSIS — R262 Difficulty in walking, not elsewhere classified: Secondary | ICD-10-CM | POA: Diagnosis not present

## 2020-01-31 DIAGNOSIS — M24661 Ankylosis, right knee: Secondary | ICD-10-CM | POA: Diagnosis not present

## 2020-01-31 DIAGNOSIS — G8918 Other acute postprocedural pain: Secondary | ICD-10-CM | POA: Diagnosis not present

## 2020-01-31 DIAGNOSIS — Z96651 Presence of right artificial knee joint: Secondary | ICD-10-CM | POA: Diagnosis not present

## 2020-01-31 DIAGNOSIS — R262 Difficulty in walking, not elsewhere classified: Secondary | ICD-10-CM | POA: Diagnosis not present

## 2020-01-31 DIAGNOSIS — R2689 Other abnormalities of gait and mobility: Secondary | ICD-10-CM | POA: Diagnosis not present

## 2020-01-31 DIAGNOSIS — S83241D Other tear of medial meniscus, current injury, right knee, subsequent encounter: Secondary | ICD-10-CM | POA: Diagnosis not present

## 2020-02-01 DIAGNOSIS — R2689 Other abnormalities of gait and mobility: Secondary | ICD-10-CM | POA: Diagnosis not present

## 2020-02-01 DIAGNOSIS — R262 Difficulty in walking, not elsewhere classified: Secondary | ICD-10-CM | POA: Diagnosis not present

## 2020-02-01 DIAGNOSIS — G8918 Other acute postprocedural pain: Secondary | ICD-10-CM | POA: Diagnosis not present

## 2020-02-01 DIAGNOSIS — S83241D Other tear of medial meniscus, current injury, right knee, subsequent encounter: Secondary | ICD-10-CM | POA: Diagnosis not present

## 2020-02-02 DIAGNOSIS — G8918 Other acute postprocedural pain: Secondary | ICD-10-CM | POA: Diagnosis not present

## 2020-02-02 DIAGNOSIS — R262 Difficulty in walking, not elsewhere classified: Secondary | ICD-10-CM | POA: Diagnosis not present

## 2020-02-02 DIAGNOSIS — S83241D Other tear of medial meniscus, current injury, right knee, subsequent encounter: Secondary | ICD-10-CM | POA: Diagnosis not present

## 2020-02-02 DIAGNOSIS — R2689 Other abnormalities of gait and mobility: Secondary | ICD-10-CM | POA: Diagnosis not present

## 2020-02-03 DIAGNOSIS — S83241D Other tear of medial meniscus, current injury, right knee, subsequent encounter: Secondary | ICD-10-CM | POA: Diagnosis not present

## 2020-02-03 DIAGNOSIS — R262 Difficulty in walking, not elsewhere classified: Secondary | ICD-10-CM | POA: Diagnosis not present

## 2020-02-03 DIAGNOSIS — R2689 Other abnormalities of gait and mobility: Secondary | ICD-10-CM | POA: Diagnosis not present

## 2020-02-03 DIAGNOSIS — G8918 Other acute postprocedural pain: Secondary | ICD-10-CM | POA: Diagnosis not present

## 2020-02-06 DIAGNOSIS — R262 Difficulty in walking, not elsewhere classified: Secondary | ICD-10-CM | POA: Diagnosis not present

## 2020-02-06 DIAGNOSIS — S83241D Other tear of medial meniscus, current injury, right knee, subsequent encounter: Secondary | ICD-10-CM | POA: Diagnosis not present

## 2020-02-06 DIAGNOSIS — R2689 Other abnormalities of gait and mobility: Secondary | ICD-10-CM | POA: Diagnosis not present

## 2020-02-06 DIAGNOSIS — G8918 Other acute postprocedural pain: Secondary | ICD-10-CM | POA: Diagnosis not present

## 2020-02-07 DIAGNOSIS — R262 Difficulty in walking, not elsewhere classified: Secondary | ICD-10-CM | POA: Diagnosis not present

## 2020-02-07 DIAGNOSIS — R2689 Other abnormalities of gait and mobility: Secondary | ICD-10-CM | POA: Diagnosis not present

## 2020-02-07 DIAGNOSIS — S83241D Other tear of medial meniscus, current injury, right knee, subsequent encounter: Secondary | ICD-10-CM | POA: Diagnosis not present

## 2020-02-07 DIAGNOSIS — G8918 Other acute postprocedural pain: Secondary | ICD-10-CM | POA: Diagnosis not present

## 2020-02-08 DIAGNOSIS — R2689 Other abnormalities of gait and mobility: Secondary | ICD-10-CM | POA: Diagnosis not present

## 2020-02-08 DIAGNOSIS — G8918 Other acute postprocedural pain: Secondary | ICD-10-CM | POA: Diagnosis not present

## 2020-02-08 DIAGNOSIS — R262 Difficulty in walking, not elsewhere classified: Secondary | ICD-10-CM | POA: Diagnosis not present

## 2020-02-08 DIAGNOSIS — S83241D Other tear of medial meniscus, current injury, right knee, subsequent encounter: Secondary | ICD-10-CM | POA: Diagnosis not present

## 2020-02-09 DIAGNOSIS — G8918 Other acute postprocedural pain: Secondary | ICD-10-CM | POA: Diagnosis not present

## 2020-02-09 DIAGNOSIS — R2689 Other abnormalities of gait and mobility: Secondary | ICD-10-CM | POA: Diagnosis not present

## 2020-02-09 DIAGNOSIS — S83241D Other tear of medial meniscus, current injury, right knee, subsequent encounter: Secondary | ICD-10-CM | POA: Diagnosis not present

## 2020-02-09 DIAGNOSIS — R262 Difficulty in walking, not elsewhere classified: Secondary | ICD-10-CM | POA: Diagnosis not present

## 2020-02-10 DIAGNOSIS — M1711 Unilateral primary osteoarthritis, right knee: Secondary | ICD-10-CM | POA: Diagnosis not present

## 2020-02-14 DIAGNOSIS — R262 Difficulty in walking, not elsewhere classified: Secondary | ICD-10-CM | POA: Diagnosis not present

## 2020-02-14 DIAGNOSIS — G8918 Other acute postprocedural pain: Secondary | ICD-10-CM | POA: Diagnosis not present

## 2020-02-14 DIAGNOSIS — S83241D Other tear of medial meniscus, current injury, right knee, subsequent encounter: Secondary | ICD-10-CM | POA: Diagnosis not present

## 2020-02-14 DIAGNOSIS — R2689 Other abnormalities of gait and mobility: Secondary | ICD-10-CM | POA: Diagnosis not present

## 2020-02-16 DIAGNOSIS — R262 Difficulty in walking, not elsewhere classified: Secondary | ICD-10-CM | POA: Diagnosis not present

## 2020-02-16 DIAGNOSIS — S83241D Other tear of medial meniscus, current injury, right knee, subsequent encounter: Secondary | ICD-10-CM | POA: Diagnosis not present

## 2020-02-16 DIAGNOSIS — G8918 Other acute postprocedural pain: Secondary | ICD-10-CM | POA: Diagnosis not present

## 2020-02-16 DIAGNOSIS — R2689 Other abnormalities of gait and mobility: Secondary | ICD-10-CM | POA: Diagnosis not present

## 2020-02-21 DIAGNOSIS — R262 Difficulty in walking, not elsewhere classified: Secondary | ICD-10-CM | POA: Diagnosis not present

## 2020-02-21 DIAGNOSIS — R2689 Other abnormalities of gait and mobility: Secondary | ICD-10-CM | POA: Diagnosis not present

## 2020-02-21 DIAGNOSIS — G8918 Other acute postprocedural pain: Secondary | ICD-10-CM | POA: Diagnosis not present

## 2020-02-21 DIAGNOSIS — S83241D Other tear of medial meniscus, current injury, right knee, subsequent encounter: Secondary | ICD-10-CM | POA: Diagnosis not present

## 2020-02-23 DIAGNOSIS — S83241D Other tear of medial meniscus, current injury, right knee, subsequent encounter: Secondary | ICD-10-CM | POA: Diagnosis not present

## 2020-02-23 DIAGNOSIS — R262 Difficulty in walking, not elsewhere classified: Secondary | ICD-10-CM | POA: Diagnosis not present

## 2020-02-23 DIAGNOSIS — G8918 Other acute postprocedural pain: Secondary | ICD-10-CM | POA: Diagnosis not present

## 2020-02-23 DIAGNOSIS — R2689 Other abnormalities of gait and mobility: Secondary | ICD-10-CM | POA: Diagnosis not present

## 2020-02-28 DIAGNOSIS — G8918 Other acute postprocedural pain: Secondary | ICD-10-CM | POA: Diagnosis not present

## 2020-02-28 DIAGNOSIS — R262 Difficulty in walking, not elsewhere classified: Secondary | ICD-10-CM | POA: Diagnosis not present

## 2020-02-28 DIAGNOSIS — R2689 Other abnormalities of gait and mobility: Secondary | ICD-10-CM | POA: Diagnosis not present

## 2020-02-28 DIAGNOSIS — S83241D Other tear of medial meniscus, current injury, right knee, subsequent encounter: Secondary | ICD-10-CM | POA: Diagnosis not present

## 2020-03-01 DIAGNOSIS — R2689 Other abnormalities of gait and mobility: Secondary | ICD-10-CM | POA: Diagnosis not present

## 2020-03-01 DIAGNOSIS — G8918 Other acute postprocedural pain: Secondary | ICD-10-CM | POA: Diagnosis not present

## 2020-03-01 DIAGNOSIS — S83241D Other tear of medial meniscus, current injury, right knee, subsequent encounter: Secondary | ICD-10-CM | POA: Diagnosis not present

## 2020-03-01 DIAGNOSIS — R262 Difficulty in walking, not elsewhere classified: Secondary | ICD-10-CM | POA: Diagnosis not present

## 2020-03-09 DIAGNOSIS — R2689 Other abnormalities of gait and mobility: Secondary | ICD-10-CM | POA: Diagnosis not present

## 2020-03-09 DIAGNOSIS — S83241D Other tear of medial meniscus, current injury, right knee, subsequent encounter: Secondary | ICD-10-CM | POA: Diagnosis not present

## 2020-03-09 DIAGNOSIS — R262 Difficulty in walking, not elsewhere classified: Secondary | ICD-10-CM | POA: Diagnosis not present

## 2020-03-09 DIAGNOSIS — G8918 Other acute postprocedural pain: Secondary | ICD-10-CM | POA: Diagnosis not present

## 2020-03-12 DIAGNOSIS — G8918 Other acute postprocedural pain: Secondary | ICD-10-CM | POA: Diagnosis not present

## 2020-03-12 DIAGNOSIS — R262 Difficulty in walking, not elsewhere classified: Secondary | ICD-10-CM | POA: Diagnosis not present

## 2020-03-12 DIAGNOSIS — S83241D Other tear of medial meniscus, current injury, right knee, subsequent encounter: Secondary | ICD-10-CM | POA: Diagnosis not present

## 2020-03-12 DIAGNOSIS — R2689 Other abnormalities of gait and mobility: Secondary | ICD-10-CM | POA: Diagnosis not present

## 2020-03-15 DIAGNOSIS — R2689 Other abnormalities of gait and mobility: Secondary | ICD-10-CM | POA: Diagnosis not present

## 2020-03-15 DIAGNOSIS — R262 Difficulty in walking, not elsewhere classified: Secondary | ICD-10-CM | POA: Diagnosis not present

## 2020-03-15 DIAGNOSIS — S83241D Other tear of medial meniscus, current injury, right knee, subsequent encounter: Secondary | ICD-10-CM | POA: Diagnosis not present

## 2020-03-15 DIAGNOSIS — G8918 Other acute postprocedural pain: Secondary | ICD-10-CM | POA: Diagnosis not present

## 2020-03-20 DIAGNOSIS — R2689 Other abnormalities of gait and mobility: Secondary | ICD-10-CM | POA: Diagnosis not present

## 2020-03-20 DIAGNOSIS — S83241D Other tear of medial meniscus, current injury, right knee, subsequent encounter: Secondary | ICD-10-CM | POA: Diagnosis not present

## 2020-03-20 DIAGNOSIS — G8918 Other acute postprocedural pain: Secondary | ICD-10-CM | POA: Diagnosis not present

## 2020-03-20 DIAGNOSIS — R262 Difficulty in walking, not elsewhere classified: Secondary | ICD-10-CM | POA: Diagnosis not present

## 2020-03-27 DIAGNOSIS — S83241D Other tear of medial meniscus, current injury, right knee, subsequent encounter: Secondary | ICD-10-CM | POA: Diagnosis not present

## 2020-03-27 DIAGNOSIS — R262 Difficulty in walking, not elsewhere classified: Secondary | ICD-10-CM | POA: Diagnosis not present

## 2020-03-27 DIAGNOSIS — R2689 Other abnormalities of gait and mobility: Secondary | ICD-10-CM | POA: Diagnosis not present

## 2020-03-27 DIAGNOSIS — G8918 Other acute postprocedural pain: Secondary | ICD-10-CM | POA: Diagnosis not present

## 2020-03-29 DIAGNOSIS — Z471 Aftercare following joint replacement surgery: Secondary | ICD-10-CM | POA: Diagnosis not present

## 2020-03-29 DIAGNOSIS — Z96651 Presence of right artificial knee joint: Secondary | ICD-10-CM | POA: Diagnosis not present

## 2020-03-29 DIAGNOSIS — M21061 Valgus deformity, not elsewhere classified, right knee: Secondary | ICD-10-CM | POA: Diagnosis not present

## 2020-03-29 DIAGNOSIS — M25461 Effusion, right knee: Secondary | ICD-10-CM | POA: Diagnosis not present

## 2020-03-30 DIAGNOSIS — S83241D Other tear of medial meniscus, current injury, right knee, subsequent encounter: Secondary | ICD-10-CM | POA: Diagnosis not present

## 2020-03-30 DIAGNOSIS — G8918 Other acute postprocedural pain: Secondary | ICD-10-CM | POA: Diagnosis not present

## 2020-03-30 DIAGNOSIS — R2689 Other abnormalities of gait and mobility: Secondary | ICD-10-CM | POA: Diagnosis not present

## 2020-03-30 DIAGNOSIS — R262 Difficulty in walking, not elsewhere classified: Secondary | ICD-10-CM | POA: Diagnosis not present

## 2020-04-03 DIAGNOSIS — R262 Difficulty in walking, not elsewhere classified: Secondary | ICD-10-CM | POA: Diagnosis not present

## 2020-04-03 DIAGNOSIS — R2689 Other abnormalities of gait and mobility: Secondary | ICD-10-CM | POA: Diagnosis not present

## 2020-04-03 DIAGNOSIS — G8918 Other acute postprocedural pain: Secondary | ICD-10-CM | POA: Diagnosis not present

## 2020-04-03 DIAGNOSIS — S83241D Other tear of medial meniscus, current injury, right knee, subsequent encounter: Secondary | ICD-10-CM | POA: Diagnosis not present

## 2020-04-05 DIAGNOSIS — S83241D Other tear of medial meniscus, current injury, right knee, subsequent encounter: Secondary | ICD-10-CM | POA: Diagnosis not present

## 2020-04-05 DIAGNOSIS — R262 Difficulty in walking, not elsewhere classified: Secondary | ICD-10-CM | POA: Diagnosis not present

## 2020-04-05 DIAGNOSIS — G8918 Other acute postprocedural pain: Secondary | ICD-10-CM | POA: Diagnosis not present

## 2020-04-05 DIAGNOSIS — R2689 Other abnormalities of gait and mobility: Secondary | ICD-10-CM | POA: Diagnosis not present

## 2020-04-12 DIAGNOSIS — G8918 Other acute postprocedural pain: Secondary | ICD-10-CM | POA: Diagnosis not present

## 2020-04-12 DIAGNOSIS — R262 Difficulty in walking, not elsewhere classified: Secondary | ICD-10-CM | POA: Diagnosis not present

## 2020-04-12 DIAGNOSIS — S83241D Other tear of medial meniscus, current injury, right knee, subsequent encounter: Secondary | ICD-10-CM | POA: Diagnosis not present

## 2020-04-12 DIAGNOSIS — R2689 Other abnormalities of gait and mobility: Secondary | ICD-10-CM | POA: Diagnosis not present

## 2020-04-17 DIAGNOSIS — L905 Scar conditions and fibrosis of skin: Secondary | ICD-10-CM | POA: Diagnosis not present

## 2020-04-17 DIAGNOSIS — D225 Melanocytic nevi of trunk: Secondary | ICD-10-CM | POA: Diagnosis not present

## 2020-04-17 DIAGNOSIS — L814 Other melanin hyperpigmentation: Secondary | ICD-10-CM | POA: Diagnosis not present

## 2020-04-17 DIAGNOSIS — L821 Other seborrheic keratosis: Secondary | ICD-10-CM | POA: Diagnosis not present

## 2020-04-17 DIAGNOSIS — L820 Inflamed seborrheic keratosis: Secondary | ICD-10-CM | POA: Diagnosis not present

## 2020-04-17 DIAGNOSIS — L570 Actinic keratosis: Secondary | ICD-10-CM | POA: Diagnosis not present

## 2020-04-20 DIAGNOSIS — S83241D Other tear of medial meniscus, current injury, right knee, subsequent encounter: Secondary | ICD-10-CM | POA: Diagnosis not present

## 2020-04-20 DIAGNOSIS — G8918 Other acute postprocedural pain: Secondary | ICD-10-CM | POA: Diagnosis not present

## 2020-04-20 DIAGNOSIS — R2689 Other abnormalities of gait and mobility: Secondary | ICD-10-CM | POA: Diagnosis not present

## 2020-04-20 DIAGNOSIS — R262 Difficulty in walking, not elsewhere classified: Secondary | ICD-10-CM | POA: Diagnosis not present

## 2020-04-23 DIAGNOSIS — H25813 Combined forms of age-related cataract, bilateral: Secondary | ICD-10-CM | POA: Diagnosis not present

## 2020-04-23 DIAGNOSIS — H40021 Open angle with borderline findings, high risk, right eye: Secondary | ICD-10-CM | POA: Diagnosis not present

## 2020-04-23 DIAGNOSIS — H401121 Primary open-angle glaucoma, left eye, mild stage: Secondary | ICD-10-CM | POA: Diagnosis not present

## 2020-04-25 DIAGNOSIS — S83241D Other tear of medial meniscus, current injury, right knee, subsequent encounter: Secondary | ICD-10-CM | POA: Diagnosis not present

## 2020-04-25 DIAGNOSIS — G8918 Other acute postprocedural pain: Secondary | ICD-10-CM | POA: Diagnosis not present

## 2020-04-25 DIAGNOSIS — R262 Difficulty in walking, not elsewhere classified: Secondary | ICD-10-CM | POA: Diagnosis not present

## 2020-04-25 DIAGNOSIS — R2689 Other abnormalities of gait and mobility: Secondary | ICD-10-CM | POA: Diagnosis not present

## 2020-05-01 DIAGNOSIS — R2689 Other abnormalities of gait and mobility: Secondary | ICD-10-CM | POA: Diagnosis not present

## 2020-05-01 DIAGNOSIS — G8918 Other acute postprocedural pain: Secondary | ICD-10-CM | POA: Diagnosis not present

## 2020-05-01 DIAGNOSIS — S83241D Other tear of medial meniscus, current injury, right knee, subsequent encounter: Secondary | ICD-10-CM | POA: Diagnosis not present

## 2020-05-01 DIAGNOSIS — R262 Difficulty in walking, not elsewhere classified: Secondary | ICD-10-CM | POA: Diagnosis not present

## 2020-05-16 DIAGNOSIS — S83241D Other tear of medial meniscus, current injury, right knee, subsequent encounter: Secondary | ICD-10-CM | POA: Diagnosis not present

## 2020-05-16 DIAGNOSIS — R2689 Other abnormalities of gait and mobility: Secondary | ICD-10-CM | POA: Diagnosis not present

## 2020-05-16 DIAGNOSIS — G8918 Other acute postprocedural pain: Secondary | ICD-10-CM | POA: Diagnosis not present

## 2020-05-16 DIAGNOSIS — R262 Difficulty in walking, not elsewhere classified: Secondary | ICD-10-CM | POA: Diagnosis not present

## 2020-05-29 DIAGNOSIS — R2689 Other abnormalities of gait and mobility: Secondary | ICD-10-CM | POA: Diagnosis not present

## 2020-05-29 DIAGNOSIS — S83241D Other tear of medial meniscus, current injury, right knee, subsequent encounter: Secondary | ICD-10-CM | POA: Diagnosis not present

## 2020-05-29 DIAGNOSIS — R262 Difficulty in walking, not elsewhere classified: Secondary | ICD-10-CM | POA: Diagnosis not present

## 2020-05-29 DIAGNOSIS — G8918 Other acute postprocedural pain: Secondary | ICD-10-CM | POA: Diagnosis not present

## 2020-08-06 DIAGNOSIS — Z20822 Contact with and (suspected) exposure to covid-19: Secondary | ICD-10-CM | POA: Diagnosis not present

## 2020-09-03 DIAGNOSIS — I10 Essential (primary) hypertension: Secondary | ICD-10-CM | POA: Diagnosis not present

## 2020-09-03 DIAGNOSIS — R051 Acute cough: Secondary | ICD-10-CM | POA: Diagnosis not present

## 2020-09-03 DIAGNOSIS — J0140 Acute pansinusitis, unspecified: Secondary | ICD-10-CM | POA: Diagnosis not present

## 2020-10-04 DIAGNOSIS — I10 Essential (primary) hypertension: Secondary | ICD-10-CM | POA: Diagnosis not present

## 2020-10-04 DIAGNOSIS — Z0000 Encounter for general adult medical examination without abnormal findings: Secondary | ICD-10-CM | POA: Diagnosis not present

## 2020-10-04 DIAGNOSIS — E7800 Pure hypercholesterolemia, unspecified: Secondary | ICD-10-CM | POA: Diagnosis not present

## 2020-10-04 DIAGNOSIS — E559 Vitamin D deficiency, unspecified: Secondary | ICD-10-CM | POA: Diagnosis not present

## 2020-10-04 DIAGNOSIS — E039 Hypothyroidism, unspecified: Secondary | ICD-10-CM | POA: Diagnosis not present

## 2020-11-12 DIAGNOSIS — I10 Essential (primary) hypertension: Secondary | ICD-10-CM | POA: Diagnosis not present

## 2020-11-12 DIAGNOSIS — G4733 Obstructive sleep apnea (adult) (pediatric): Secondary | ICD-10-CM | POA: Diagnosis not present

## 2020-11-13 ENCOUNTER — Other Ambulatory Visit: Payer: Self-pay | Admitting: Obstetrics and Gynecology

## 2020-11-13 DIAGNOSIS — M8580 Other specified disorders of bone density and structure, unspecified site: Secondary | ICD-10-CM

## 2020-11-13 DIAGNOSIS — Z01419 Encounter for gynecological examination (general) (routine) without abnormal findings: Secondary | ICD-10-CM | POA: Diagnosis not present

## 2020-11-13 DIAGNOSIS — Z780 Asymptomatic menopausal state: Secondary | ICD-10-CM

## 2020-11-27 DIAGNOSIS — H401121 Primary open-angle glaucoma, left eye, mild stage: Secondary | ICD-10-CM | POA: Diagnosis not present

## 2020-11-27 DIAGNOSIS — H40021 Open angle with borderline findings, high risk, right eye: Secondary | ICD-10-CM | POA: Diagnosis not present

## 2020-12-10 DIAGNOSIS — M79672 Pain in left foot: Secondary | ICD-10-CM | POA: Diagnosis not present

## 2020-12-11 ENCOUNTER — Other Ambulatory Visit: Payer: Self-pay | Admitting: Family Medicine

## 2020-12-11 DIAGNOSIS — Z1231 Encounter for screening mammogram for malignant neoplasm of breast: Secondary | ICD-10-CM

## 2020-12-17 DIAGNOSIS — G4733 Obstructive sleep apnea (adult) (pediatric): Secondary | ICD-10-CM | POA: Diagnosis not present

## 2020-12-19 DIAGNOSIS — M25461 Effusion, right knee: Secondary | ICD-10-CM | POA: Diagnosis not present

## 2020-12-19 DIAGNOSIS — Z888 Allergy status to other drugs, medicaments and biological substances status: Secondary | ICD-10-CM | POA: Diagnosis not present

## 2020-12-19 DIAGNOSIS — Z471 Aftercare following joint replacement surgery: Secondary | ICD-10-CM | POA: Diagnosis not present

## 2020-12-19 DIAGNOSIS — Z885 Allergy status to narcotic agent status: Secondary | ICD-10-CM | POA: Diagnosis not present

## 2020-12-19 DIAGNOSIS — Z96651 Presence of right artificial knee joint: Secondary | ICD-10-CM | POA: Diagnosis not present

## 2020-12-19 DIAGNOSIS — M76891 Other specified enthesopathies of right lower limb, excluding foot: Secondary | ICD-10-CM | POA: Diagnosis not present

## 2021-01-16 ENCOUNTER — Ambulatory Visit
Admission: RE | Admit: 2021-01-16 | Discharge: 2021-01-16 | Disposition: A | Discharge status: Home / Self Care | Payer: BC Managed Care – PPO | Source: Ambulatory Visit | Attending: Family Medicine | Admitting: Family Medicine

## 2021-01-16 DIAGNOSIS — Z1231 Encounter for screening mammogram for malignant neoplasm of breast: Secondary | ICD-10-CM

## 2021-03-13 DIAGNOSIS — M19271 Secondary osteoarthritis, right ankle and foot: Secondary | ICD-10-CM | POA: Diagnosis not present

## 2021-03-13 DIAGNOSIS — M7741 Metatarsalgia, right foot: Secondary | ICD-10-CM | POA: Diagnosis not present

## 2021-03-13 DIAGNOSIS — M19272 Secondary osteoarthritis, left ankle and foot: Secondary | ICD-10-CM | POA: Diagnosis not present

## 2021-03-13 DIAGNOSIS — M7742 Metatarsalgia, left foot: Secondary | ICD-10-CM | POA: Diagnosis not present

## 2021-04-11 DIAGNOSIS — G4733 Obstructive sleep apnea (adult) (pediatric): Secondary | ICD-10-CM | POA: Diagnosis not present

## 2021-04-11 DIAGNOSIS — I10 Essential (primary) hypertension: Secondary | ICD-10-CM | POA: Diagnosis not present

## 2021-04-11 DIAGNOSIS — R5383 Other fatigue: Secondary | ICD-10-CM | POA: Diagnosis not present

## 2021-04-11 DIAGNOSIS — E039 Hypothyroidism, unspecified: Secondary | ICD-10-CM | POA: Diagnosis not present

## 2021-04-11 DIAGNOSIS — E7800 Pure hypercholesterolemia, unspecified: Secondary | ICD-10-CM | POA: Diagnosis not present

## 2021-04-11 DIAGNOSIS — Z79899 Other long term (current) drug therapy: Secondary | ICD-10-CM | POA: Diagnosis not present

## 2021-04-18 DIAGNOSIS — D225 Melanocytic nevi of trunk: Secondary | ICD-10-CM | POA: Diagnosis not present

## 2021-04-18 DIAGNOSIS — L814 Other melanin hyperpigmentation: Secondary | ICD-10-CM | POA: Diagnosis not present

## 2021-04-18 DIAGNOSIS — L821 Other seborrheic keratosis: Secondary | ICD-10-CM | POA: Diagnosis not present

## 2021-04-18 DIAGNOSIS — L570 Actinic keratosis: Secondary | ICD-10-CM | POA: Diagnosis not present

## 2021-04-25 ENCOUNTER — Ambulatory Visit
Admission: RE | Admit: 2021-04-25 | Discharge: 2021-04-25 | Disposition: A | Discharge status: Home / Self Care | Payer: BC Managed Care – PPO | Source: Ambulatory Visit | Attending: Obstetrics and Gynecology | Admitting: Obstetrics and Gynecology

## 2021-04-25 DIAGNOSIS — M8588 Other specified disorders of bone density and structure, other site: Secondary | ICD-10-CM | POA: Diagnosis not present

## 2021-04-25 DIAGNOSIS — M810 Age-related osteoporosis without current pathological fracture: Secondary | ICD-10-CM | POA: Diagnosis not present

## 2021-04-25 DIAGNOSIS — Z780 Asymptomatic menopausal state: Secondary | ICD-10-CM | POA: Diagnosis not present

## 2021-04-25 DIAGNOSIS — M8580 Other specified disorders of bone density and structure, unspecified site: Secondary | ICD-10-CM

## 2021-06-07 IMAGING — MG DIGITAL SCREENING BILAT W/ TOMO W/ CAD
8 series · 8 of 24 positions shown · non-contrast
Comparison: Previous exam(s).

CLINICAL DATA: Screening.

EXAM:
DIGITAL SCREENING BILATERAL MAMMOGRAM WITH TOMO AND CAD

[L MLO synth-2D]
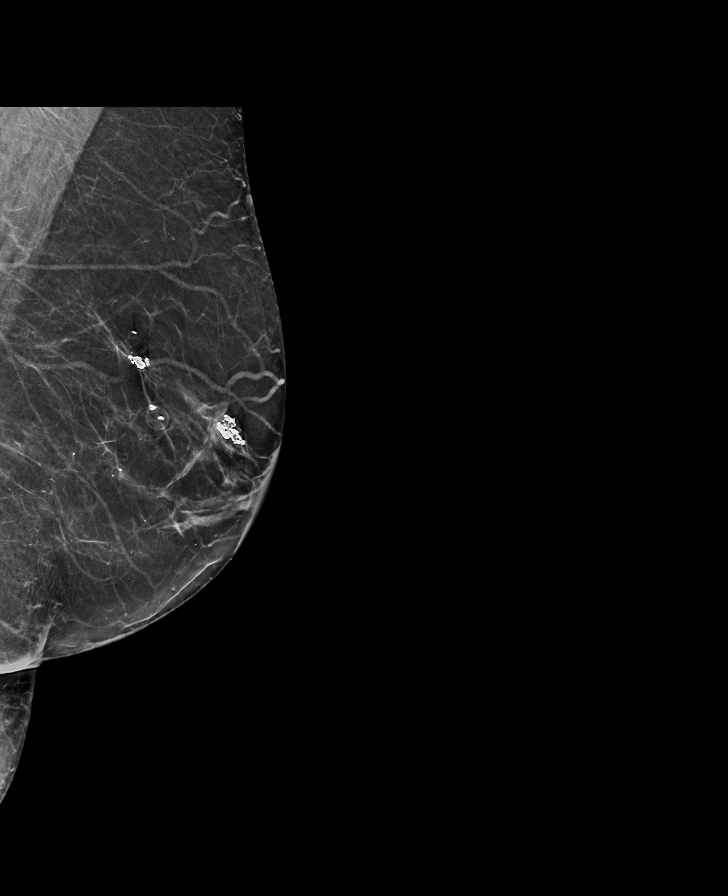

[L CC synth-2D]
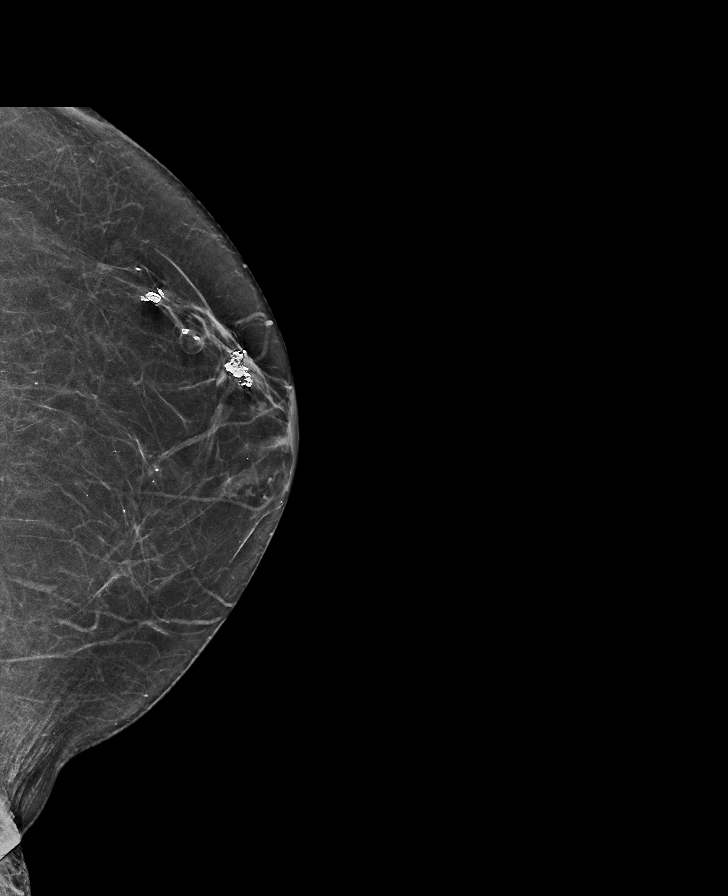

[R CC synth-2D]
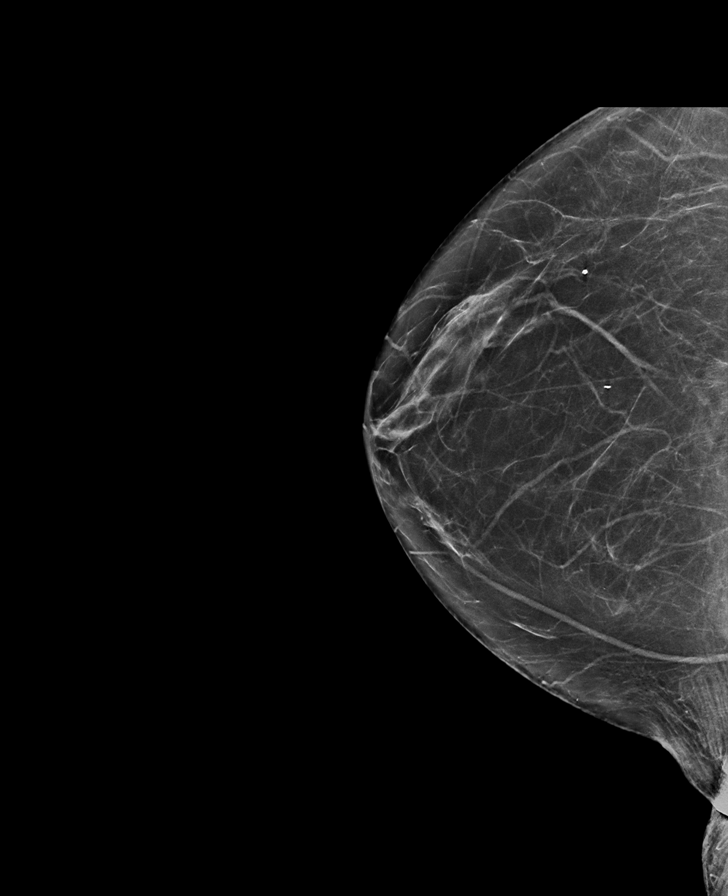

[R MLO synth-2D]
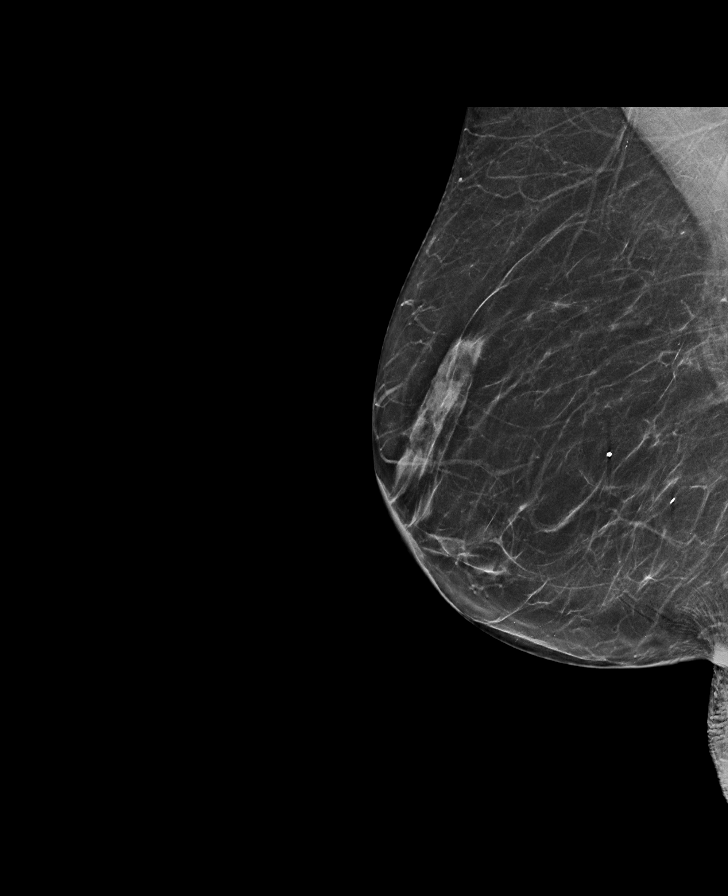

[R CC tomo · tomo slice 35/68.0]
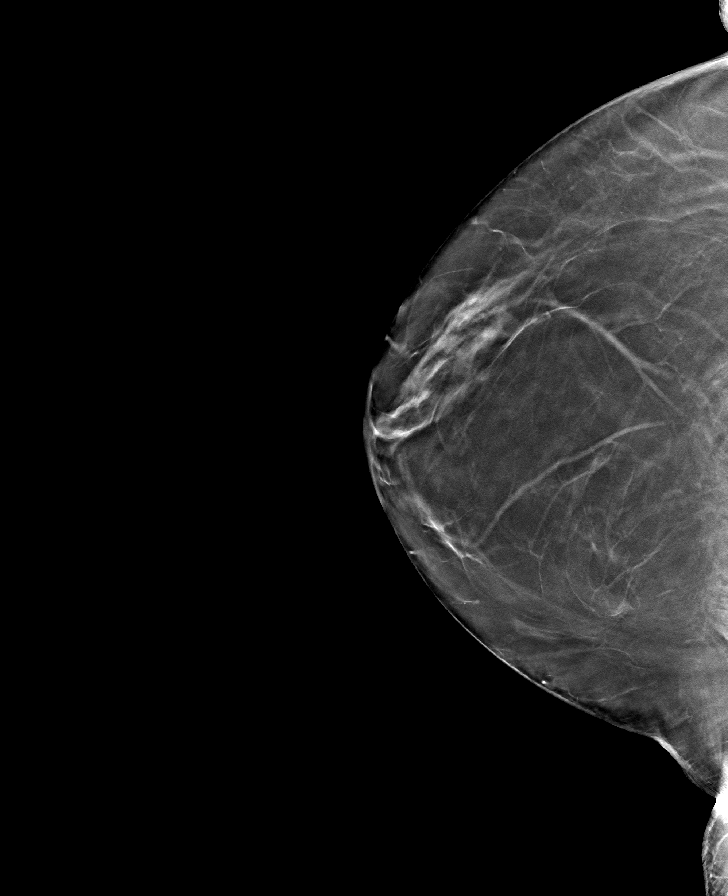

[L CC tomo · tomo slice 31/62.0]
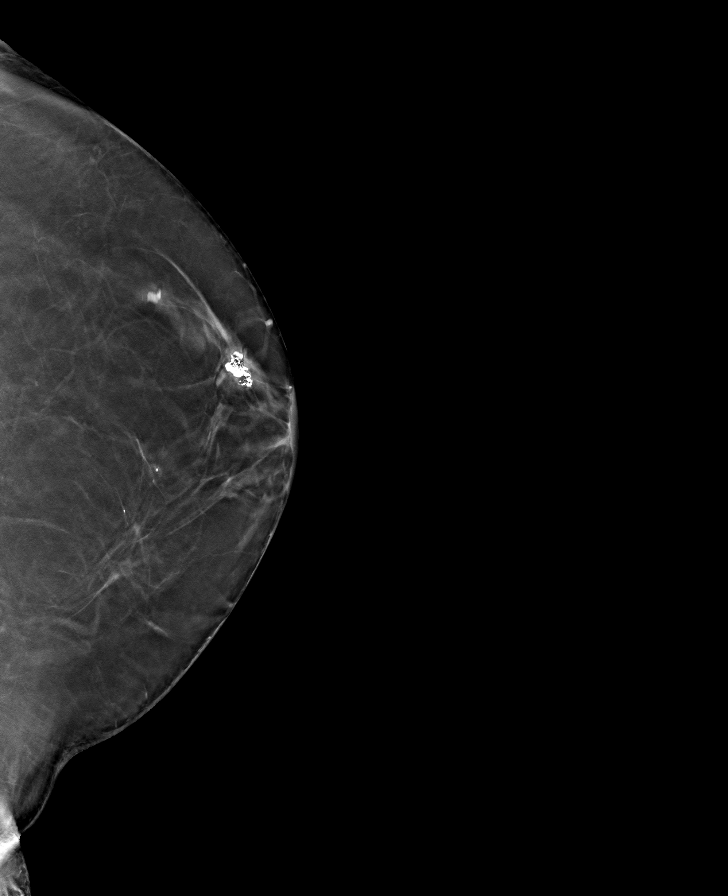

[L MLO tomo · tomo slice 31/62.0]
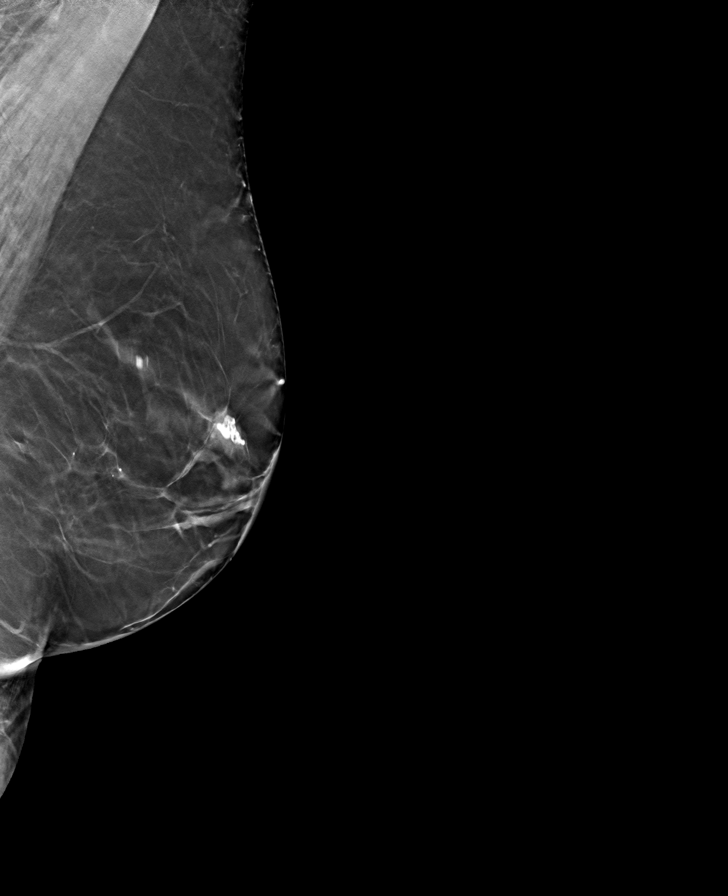

[R MLO tomo · tomo slice 35/68.0]
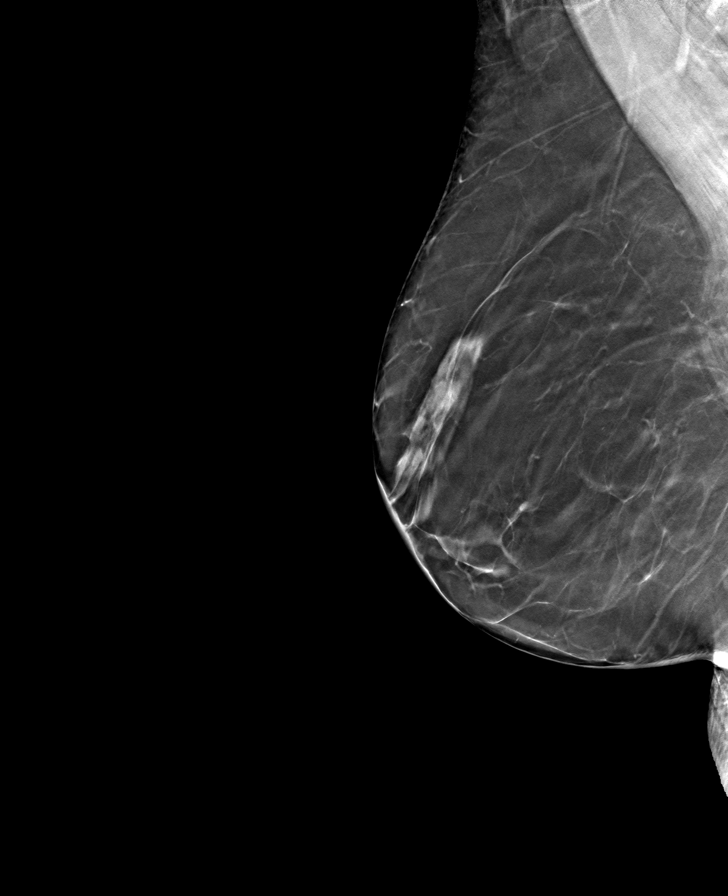

[8 of 24 positions shown; findings below may reference images not displayed]

ACR Breast Density Category b: There are scattered areas of
fibroglandular density.
FINDINGS: There are no findings suspicious for malignancy. Images were
processed with CAD.
IMPRESSION: No mammographic evidence of malignancy. A result letter of this
screening mammogram will be mailed directly to the patient.

RECOMMENDATION:
Screening mammogram in one year. (Code:CN-[id number])

BI-RADS CATEGORY  1: Negative.

## 2021-06-10 DIAGNOSIS — M810 Age-related osteoporosis without current pathological fracture: Secondary | ICD-10-CM | POA: Diagnosis not present

## 2021-06-18 DIAGNOSIS — B001 Herpesviral vesicular dermatitis: Secondary | ICD-10-CM | POA: Diagnosis not present

## 2021-06-18 DIAGNOSIS — G4733 Obstructive sleep apnea (adult) (pediatric): Secondary | ICD-10-CM | POA: Diagnosis not present

## 2021-06-18 DIAGNOSIS — L570 Actinic keratosis: Secondary | ICD-10-CM | POA: Diagnosis not present

## 2021-06-24 DIAGNOSIS — H40021 Open angle with borderline findings, high risk, right eye: Secondary | ICD-10-CM | POA: Diagnosis not present

## 2021-06-24 DIAGNOSIS — H25813 Combined forms of age-related cataract, bilateral: Secondary | ICD-10-CM | POA: Diagnosis not present

## 2021-06-24 DIAGNOSIS — H04123 Dry eye syndrome of bilateral lacrimal glands: Secondary | ICD-10-CM | POA: Diagnosis not present

## 2021-06-24 DIAGNOSIS — H401121 Primary open-angle glaucoma, left eye, mild stage: Secondary | ICD-10-CM | POA: Diagnosis not present

## 2021-07-31 DIAGNOSIS — M1712 Unilateral primary osteoarthritis, left knee: Secondary | ICD-10-CM | POA: Diagnosis not present

## 2021-07-31 DIAGNOSIS — I998 Other disorder of circulatory system: Secondary | ICD-10-CM | POA: Diagnosis not present

## 2021-07-31 DIAGNOSIS — Z96651 Presence of right artificial knee joint: Secondary | ICD-10-CM | POA: Diagnosis not present

## 2021-07-31 DIAGNOSIS — M7651 Patellar tendinitis, right knee: Secondary | ICD-10-CM | POA: Diagnosis not present

## 2021-07-31 DIAGNOSIS — M25562 Pain in left knee: Secondary | ICD-10-CM | POA: Diagnosis not present

## 2021-07-31 DIAGNOSIS — M25462 Effusion, left knee: Secondary | ICD-10-CM | POA: Diagnosis not present

## 2021-07-31 DIAGNOSIS — M25461 Effusion, right knee: Secondary | ICD-10-CM | POA: Diagnosis not present

## 2021-08-09 DIAGNOSIS — D125 Benign neoplasm of sigmoid colon: Secondary | ICD-10-CM | POA: Diagnosis not present

## 2021-08-09 DIAGNOSIS — Z86010 Personal history of colonic polyps: Secondary | ICD-10-CM | POA: Diagnosis not present

## 2021-08-09 DIAGNOSIS — K5730 Diverticulosis of large intestine without perforation or abscess without bleeding: Secondary | ICD-10-CM | POA: Diagnosis not present

## 2021-08-09 DIAGNOSIS — K648 Other hemorrhoids: Secondary | ICD-10-CM | POA: Diagnosis not present

## 2021-09-25 DIAGNOSIS — D485 Neoplasm of uncertain behavior of skin: Secondary | ICD-10-CM | POA: Diagnosis not present

## 2021-09-25 DIAGNOSIS — L570 Actinic keratosis: Secondary | ICD-10-CM | POA: Diagnosis not present

## 2021-09-25 DIAGNOSIS — B079 Viral wart, unspecified: Secondary | ICD-10-CM | POA: Diagnosis not present

## 2021-09-25 DIAGNOSIS — Z09 Encounter for follow-up examination after completed treatment for conditions other than malignant neoplasm: Secondary | ICD-10-CM | POA: Diagnosis not present

## 2021-09-26 DIAGNOSIS — H40021 Open angle with borderline findings, high risk, right eye: Secondary | ICD-10-CM | POA: Diagnosis not present

## 2021-09-26 DIAGNOSIS — H25813 Combined forms of age-related cataract, bilateral: Secondary | ICD-10-CM | POA: Diagnosis not present

## 2021-09-26 DIAGNOSIS — H401121 Primary open-angle glaucoma, left eye, mild stage: Secondary | ICD-10-CM | POA: Diagnosis not present

## 2021-09-26 DIAGNOSIS — H04123 Dry eye syndrome of bilateral lacrimal glands: Secondary | ICD-10-CM | POA: Diagnosis not present

## 2021-10-11 DIAGNOSIS — Z0000 Encounter for general adult medical examination without abnormal findings: Secondary | ICD-10-CM | POA: Diagnosis not present

## 2021-11-14 DIAGNOSIS — M1712 Unilateral primary osteoarthritis, left knee: Secondary | ICD-10-CM | POA: Diagnosis not present

## 2021-11-14 DIAGNOSIS — Z96651 Presence of right artificial knee joint: Secondary | ICD-10-CM | POA: Diagnosis not present

## 2021-11-18 DIAGNOSIS — Z01419 Encounter for gynecological examination (general) (routine) without abnormal findings: Secondary | ICD-10-CM | POA: Diagnosis not present

## 2021-11-20 DIAGNOSIS — G4733 Obstructive sleep apnea (adult) (pediatric): Secondary | ICD-10-CM | POA: Diagnosis not present

## 2021-12-31 ENCOUNTER — Other Ambulatory Visit: Payer: Self-pay | Admitting: Family Medicine

## 2021-12-31 DIAGNOSIS — Z1231 Encounter for screening mammogram for malignant neoplasm of breast: Secondary | ICD-10-CM

## 2022-02-06 ENCOUNTER — Ambulatory Visit
Admission: RE | Admit: 2022-02-06 | Discharge: 2022-02-06 | Disposition: A | Discharge status: Home / Self Care | Payer: Medicare Other | Source: Ambulatory Visit | Attending: Family Medicine | Admitting: Family Medicine

## 2022-02-06 DIAGNOSIS — Z1231 Encounter for screening mammogram for malignant neoplasm of breast: Secondary | ICD-10-CM

## 2022-03-20 ENCOUNTER — Ambulatory Visit (HOSPITAL_BASED_OUTPATIENT_CLINIC_OR_DEPARTMENT_OTHER): Payer: Medicare Other

## 2022-03-20 ENCOUNTER — Other Ambulatory Visit (HOSPITAL_BASED_OUTPATIENT_CLINIC_OR_DEPARTMENT_OTHER): Payer: Self-pay | Admitting: Family Medicine

## 2022-03-20 DIAGNOSIS — R1011 Right upper quadrant pain: Secondary | ICD-10-CM

## 2022-03-21 ENCOUNTER — Ambulatory Visit (HOSPITAL_BASED_OUTPATIENT_CLINIC_OR_DEPARTMENT_OTHER)
Admission: RE | Admit: 2022-03-21 | Discharge: 2022-03-21 | Disposition: A | Discharge status: Home / Self Care | Payer: Medicare Other | Source: Ambulatory Visit | Attending: Family Medicine | Admitting: Family Medicine

## 2022-03-21 DIAGNOSIS — R1011 Right upper quadrant pain: Secondary | ICD-10-CM | POA: Insufficient documentation

## 2022-12-25 ENCOUNTER — Other Ambulatory Visit: Payer: Self-pay | Admitting: Family Medicine

## 2022-12-25 DIAGNOSIS — Z1231 Encounter for screening mammogram for malignant neoplasm of breast: Secondary | ICD-10-CM

## 2023-02-16 ENCOUNTER — Ambulatory Visit
Admission: RE | Admit: 2023-02-16 | Discharge: 2023-02-16 | Disposition: A | Discharge status: Home / Self Care | Payer: Medicare Other | Source: Ambulatory Visit | Attending: Family Medicine | Admitting: Family Medicine

## 2023-02-16 DIAGNOSIS — Z1231 Encounter for screening mammogram for malignant neoplasm of breast: Secondary | ICD-10-CM

## 2023-03-12 ENCOUNTER — Other Ambulatory Visit: Payer: Self-pay | Admitting: Family Medicine

## 2023-03-12 DIAGNOSIS — S0093XA Contusion of unspecified part of head, initial encounter: Secondary | ICD-10-CM

## 2023-03-13 ENCOUNTER — Ambulatory Visit
Admission: RE | Admit: 2023-03-13 | Discharge: 2023-03-13 | Disposition: A | Discharge status: Home / Self Care | Payer: Medicare Other | Source: Ambulatory Visit | Attending: Family Medicine | Admitting: Family Medicine

## 2023-03-13 DIAGNOSIS — S0093XA Contusion of unspecified part of head, initial encounter: Secondary | ICD-10-CM

## 2023-07-15 ENCOUNTER — Other Ambulatory Visit: Payer: Self-pay | Admitting: *Deleted

## 2023-07-15 ENCOUNTER — Encounter: Payer: Self-pay | Admitting: Cardiology

## 2023-07-15 ENCOUNTER — Ambulatory Visit: Attending: Cardiology | Admitting: Cardiology

## 2023-07-15 ENCOUNTER — Other Ambulatory Visit: Payer: Self-pay | Admitting: Cardiology

## 2023-07-15 VITALS — BP 157/76 | HR 72 | Ht 60.0 in | Wt 144.0 lb

## 2023-07-15 DIAGNOSIS — R011 Cardiac murmur, unspecified: Secondary | ICD-10-CM | POA: Diagnosis present

## 2023-07-15 DIAGNOSIS — E7800 Pure hypercholesterolemia, unspecified: Secondary | ICD-10-CM

## 2023-07-15 DIAGNOSIS — Z79899 Other long term (current) drug therapy: Secondary | ICD-10-CM | POA: Insufficient documentation

## 2023-07-15 DIAGNOSIS — R072 Precordial pain: Secondary | ICD-10-CM

## 2023-07-15 DIAGNOSIS — Z8249 Family history of ischemic heart disease and other diseases of the circulatory system: Secondary | ICD-10-CM | POA: Diagnosis present

## 2023-07-15 MED ORDER — EZETIMIBE 10 MG PO TABS: 10.0000 mg | ORAL_TABLET | Freq: Every day | ORAL | 3 refills | Status: AC

## 2023-07-15 MED ORDER — EZETIMIBE 10 MG PO TABS: 10.0000 mg | ORAL_TABLET | Freq: Every day | ORAL | 3 refills | Status: DC

## 2023-07-15 MED ORDER — ASPIRIN 81 MG PO TBEC: 81.0000 mg | DELAYED_RELEASE_TABLET | Freq: Every day | ORAL | Status: AC

## 2023-07-15 NOTE — Progress Notes (Signed)
 Cardiology Office Note:  .   Date:  07/15/2023  ID:  Toni Curry, DOB 1946/01/01, MRN [id number] PCP: Roselind Congo, MD  [facility] Providers Cardiologist:  None     History of Present Illness: .   Toni Curry is a 78 y.o. female Discussed the use of AI scribe software for clinical note transcription with the patient, who gave verbal consent to proceed.  History of Present Illness Toni Curry is a 77 year old female with coronary artery disease who presents with chest discomfort.  She experiences sharp, fleeting chest pain that occurs occasionally, such as when getting dressed in the morning. The pain is described as a sharp sensation that quickly resolves. Over the past few weeks, she has noticed similar episodes, but they do not last long. No exertional chest pain or consistent shortness of breath. Occasional shortness of breath with exertion is noted.  In 2019, she was found to have coronary artery calcifications and mild to moderate coronary artery disease in the proximal to mid LAD, and mild CAD in the proximal RCA and left circumflex arteries. A coronary CT scan at that time showed no significant flow-limiting disease with an FFR analysis, and her coronary calcium  score was 572, placing her in the 92nd percentile. An echocardiogram in 2019 showed a normal ejection fraction of 60% with grade 1 diastolic dysfunction, trivial aortic regurgitation, and mildly increased pulmonary artery pressures of 41 mmHg.  She has a strong family history of heart disease, with her son having had a heart attack in his thirties and passing away at 56, her sister having had a heart issue before dying of lung cancer, and all three of her brothers having experienced heart issues, including heart attacks and stents. Both her mother and father also had heart disease.  She is currently taking rosuvastatin  10 mg three times a week, amlodipine 2.5 mg daily, and chlorthalidone 25 mg in the morning. Her  LDL cholesterol is 114 mg/dL.  She experiences shortness of breath when exerting herself, such as walking up a hill, which she attributes to a lack of physical activity and reduced stamina. She has never smoked and has lost some weight, which has improved her breathing. She previously consulted a lung specialist, and her lung function was found to be normal.   In the past she has seen pulmonology.  Lung tests were reassuring.  This was after echocardiogram showed pulmonary pressures of 41 mmHg.  Murmur still present.   ROS: No syncope  Studies Reviewed: Aaron Aas    EKG Interpretation Date/Time:  Wednesday Jul 15 2023 08:34:56 EDT Ventricular Rate:  72 PR Interval:  180 QRS Duration:  92 QT Interval:  398 QTC Calculation: 435 R Axis:   39  Text Interpretation: Normal sinus rhythm Normal ECG When compared with ECG of 24-Apr-2014 08:21, No significant change was found Confirmed by Dorothye Gathers ([id number]) on 07/15/2023 8:37:58 AM    Results LABS LDL: 114 (04/2023) Hb: 12.6 (04/2023) Cr: 0.6 (04/2023)  RADIOLOGY Coronary CT scan: No significant flow-limiting disease with FFR analysis. Coronary calcium  score of 572 (92nd percentile). Mild to moderate CAD in proximal to mid LAD. Mild CAD in proximal RCA and left circumflex arteries. (2019)  DIAGNOSTIC Echocardiogram: Normal ejection fraction of 60%. Grade 1 diastolic dysfunction. Trivial aortic regurgitation. Mildly increased pulmonary artery pressures of 41 mmHg. (2019) Nuclear stress test: Reassuring low risk. (2017) EKG: Normal rhythm. (07/15/2023)  Risk Assessment/Calculations:  Physical Exam:   VS:  BP (!) 157/76   Pulse 72   Ht 5' (1.524 m)   Wt 144 lb (65.3 kg)   SpO2 97%   BMI 28.12 kg/m    Wt Readings from Last 3 Encounters:  07/15/23 144 lb (65.3 kg)  11/24/17 175 lb 6.4 oz (79.6 kg)  03/21/15 171 lb (77.6 kg)    GEN: Well nourished, well developed in no acute distress NECK: No JVD; No carotid bruits CARDIAC:  RRR, 2/6 SM, no rubs, no gallops RESPIRATORY:  Clear to auscultation without rales, wheezing or rhonchi  ABDOMEN: Soft, non-tender, non-distended EXTREMITIES:  No edema; No deformity   ASSESSMENT AND PLAN: .    Assessment and Plan Assessment & Plan Coronary artery disease Intermittent chest discomfort, likely musculoskeletal or nerve-related given the fleeting nature and lack of exertional component. Family history of significant heart disease. Previous coronary CT scan in 2019 showed mild to moderate CAD in the proximal to mid LAD and mild CAD in the proximal RCA and left circumflex arteries. Coronary calcium  score was 572 (92nd percentile). Echocardiogram in 2019 showed normal ejection fraction with trivial aortic regurgitation and mildly increased pulmonary artery pressures. Current EKG is normal. Plan to reassess with stress test and echocardiogram to ensure stability and rule out significant ischemia. - Order nuclear stress test and echocardiogram - Initiate low dose aspirin  81 mg daily - Educate on signs of bleeding to monitor  Hyperlipidemia LDL cholesterol is 114, which is above the target of less than 70 for individuals with coronary artery disease. Currently on rosuvastatin  10 mg three times a week. Plan to add ezetimibe  to enhance LDL reduction. Discussed the addition of ezetimibe , a non-statin medication, to further lower LDL levels. Explained that ezetimibe  works differently from statins and can be taken daily alongside the current statin regimen to achieve better cholesterol control. - Initiate ezetimibe  10 mg daily - Recheck lipid panel in 3 months  Hypertension Blood pressure management with current regimen including amlodipine and chlorthalidone.       Informed Consent   Shared Decision Making/Informed Consent The risks [chest pain, shortness of breath, cardiac arrhythmias, dizziness, blood pressure fluctuations, myocardial infarction, stroke/transient ischemic attack,  nausea, vomiting, allergic reaction, radiation exposure, metallic taste sensation and life-threatening complications (estimated to be 1 in 10,000)], benefits (risk stratification, diagnosing coronary artery disease, treatment guidance) and alternatives of a nuclear stress test were discussed in detail with Ms. Creamer and she agrees to proceed.     Dispo: PRN  Signed, Dorothye Gathers, MD

## 2023-07-15 NOTE — Patient Instructions (Signed)
 Medication Instructions:  Please start Aspirin 81 mg a day. Start Zetia 10 mg a day. Continue all other medications as listed.  *If you need a refill on your cardiac medications before your next appointment, please call your pharmacy*  Lab Work: Please have blood work in 3 months (Aug 2025)  If you have labs (blood work) drawn today and your tests are completely normal, you will receive your results only by: MyChart Message (if you have MyChart) OR A paper copy in the mail If you have any lab test that is abnormal or we need to change your treatment, we will call you to review the results.  Testing/Procedures: Your physician has requested that you have an echocardiogram. Echocardiography is a painless test that uses sound waves to create images of your heart. It provides your doctor with information about the size and shape of your heart and how well your heart's chambers and valves are working. This procedure takes approximately one hour. There are no restrictions for this procedure. Please do NOT wear cologne, perfume, aftershave, or lotions (deodorant is allowed). Please arrive 15 minutes prior to your appointment time.  Please note: We ask at that you not bring children with you during ultrasound (echo/ vascular) testing. Due to room size and safety concerns, children are not allowed in the ultrasound rooms during exams. Our front office staff cannot provide observation of children in our lobby area while testing is being conducted. An adult accompanying a patient to their appointment will only be allowed in the ultrasound room at the discretion of the ultrasound technician under special circumstances. We apologize for any inconvenience.  You are scheduled for a Lexiscan Myocardial Perfusion Imaging Study on       at    .   Please arrive 15 minutes prior to your appointment time for registration and insurance purposes.   The test will take approximately 3 to 4 hours to complete; you may  bring reading material. If someone comes with you to your appointment, they will need to remain in the main lobby due to limited space in the testing area.   How to prepare for your Lexiscan Myocardial Perfusion test:   Do not eat or drink 3 hours prior to your test, except you may have water.    Do not consume products containing caffeine (regular or decaffeinated) 12 hours prior to your test (ex: coffee, chocolate, soda, tea)   Do bring a list of your current medications with you. If not listed below, you may take your medications as normal.   Bring any held medication to your appointment, as you may be required to take it once the test is complete.   Do wear comfortable clothes (no dresses or overalls) and walking shoes. Tennis shoes are preferred. No heels or open toed shoes.  Do not wear cologne, perfume, aftershave or lotions (deodorant is allowed).   If these instructions are not followed, you test will have to be rescheduled.   Please report to [address] for your test. If you have questions or concerns about your appointment, please call the Nuclear Lab at #[phone].  If you cannot keep your appointment, please provide 24 hour notification to the Nuclear lab to avoid a possible $50 charge to your account.     Follow-Up: At [facility], you and your health needs are our priority.  As part of our continuing mission to provide you with exceptional heart care, our providers are all part of one team.  This team includes your primary Cardiologist (physician) and Advanced Practice Providers or APPs (Physician Assistants and Nurse Practitioners) who all work together to provide you with the care you need, when you need it.  Your next appointment:   Follow up will be based on the results of the above testing.   We recommend signing up for the patient portal called "MyChart".  Sign up information is provided on this After Visit Summary.  MyChart is used to connect  with patients for Virtual Visits (Telemedicine).  Patients are able to view lab/test results, encounter notes, upcoming appointments, etc.  Non-urgent messages can be sent to your provider as well.   To learn more about what you can do with MyChart, go to [email or web address].

## 2023-07-17 ENCOUNTER — Telehealth (HOSPITAL_COMMUNITY): Payer: Self-pay | Admitting: *Deleted

## 2023-07-17 NOTE — Telephone Encounter (Signed)
 Left a message on home voice mail regarding her Stress Test 07/24/23 at 7:30.

## 2023-07-24 ENCOUNTER — Ambulatory Visit: Payer: Self-pay | Admitting: Cardiology

## 2023-07-24 ENCOUNTER — Ambulatory Visit (HOSPITAL_COMMUNITY)
Admission: RE | Admit: 2023-07-24 | Discharge: 2023-07-24 | Disposition: A | Discharge status: Home / Self Care | Source: Ambulatory Visit | Attending: Internal Medicine | Admitting: Internal Medicine

## 2023-07-24 DIAGNOSIS — R072 Precordial pain: Secondary | ICD-10-CM

## 2023-07-24 LAB — MYOCARDIAL PERFUSION IMAGING
Base ST Depression (mm): 0 mm
LV dias vol: 73 mL (ref 46–106)
LV sys vol: 14 mL
Nuc Stress EF: 81 %
Peak HR: 98 {beats}/min
Rest BP: 71 mmHg
Rest HR: 71 {beats}/min
Rest Nuclear Isotope Dose: 10.9 mCi
SDS: 0
SRS: 15
SSS: 9
ST Depression (mm): 0 mm
Stress Nuclear Isotope Dose: 30.2 mCi
TID: 1

## 2023-07-24 MED ORDER — REGADENOSON 0.4 MG/5ML IV SOLN
0.4000 mg | Freq: Once | INTRAVENOUS | Status: AC
  Administered 2023-07-24: 0.4 mg via INTRAVENOUS

## 2023-07-24 MED ORDER — TECHNETIUM TC 99M TETROFOSMIN IV KIT
30.2000 | PACK | Freq: Once | INTRAVENOUS | Status: AC | PRN
  Administered 2023-07-24: 30.2 via INTRAVENOUS

## 2023-07-24 MED ORDER — REGADENOSON 0.4 MG/5ML IV SOLN
INTRAVENOUS | Status: AC
  Filled 2023-07-24: qty 5

## 2023-07-24 MED ORDER — TECHNETIUM TC 99M TETROFOSMIN IV KIT
10.9000 | PACK | Freq: Once | INTRAVENOUS | Status: AC | PRN
  Administered 2023-07-24: 10.9 via INTRAVENOUS

## 2023-07-27 ENCOUNTER — Ambulatory Visit: Payer: Self-pay | Admitting: Cardiology

## 2023-08-07 ENCOUNTER — Ambulatory Visit (HOSPITAL_BASED_OUTPATIENT_CLINIC_OR_DEPARTMENT_OTHER): Admitting: Cardiology

## 2023-08-19 ENCOUNTER — Ambulatory Visit (HOSPITAL_COMMUNITY)
Admission: RE | Admit: 2023-08-19 | Discharge: 2023-08-19 | Disposition: A | Discharge status: Home / Self Care | Source: Ambulatory Visit | Attending: Cardiology | Admitting: Cardiology

## 2023-08-19 DIAGNOSIS — R011 Cardiac murmur, unspecified: Secondary | ICD-10-CM

## 2023-08-19 DIAGNOSIS — R072 Precordial pain: Secondary | ICD-10-CM | POA: Diagnosis not present

## 2023-08-19 LAB — ECHOCARDIOGRAM COMPLETE
Area-P 1/2: 3.37 cm2
P 1/2 time: 416 ms
S' Lateral: 2.1 cm

## 2024-01-26 ENCOUNTER — Other Ambulatory Visit: Payer: Self-pay | Admitting: Family Medicine

## 2024-01-26 DIAGNOSIS — Z1231 Encounter for screening mammogram for malignant neoplasm of breast: Secondary | ICD-10-CM

## 2024-02-23 ENCOUNTER — Ambulatory Visit
Admission: RE | Admit: 2024-02-23 | Discharge: 2024-02-23 | Disposition: A | Source: Ambulatory Visit | Attending: Family Medicine | Admitting: Family Medicine

## 2024-02-23 DIAGNOSIS — Z1231 Encounter for screening mammogram for malignant neoplasm of breast: Secondary | ICD-10-CM
